# Patient Record
Sex: Male | Born: 1960 | Race: White | Hispanic: No | Marital: Married | Smoking: Never smoker
Health system: Southern US, Community
[De-identification: ages and names within clinical notes are randomized; demographics above are authoritative.]

## PROBLEM LIST (undated history)

## (undated) DIAGNOSIS — K219 Gastro-esophageal reflux disease without esophagitis: Secondary | ICD-10-CM

## (undated) DIAGNOSIS — K635 Polyp of colon: Secondary | ICD-10-CM

## (undated) DIAGNOSIS — E785 Hyperlipidemia, unspecified: Secondary | ICD-10-CM

## (undated) DIAGNOSIS — N289 Disorder of kidney and ureter, unspecified: Secondary | ICD-10-CM

## (undated) DIAGNOSIS — N400 Enlarged prostate without lower urinary tract symptoms: Secondary | ICD-10-CM

## (undated) DIAGNOSIS — M722 Plantar fascial fibromatosis: Secondary | ICD-10-CM

## (undated) DIAGNOSIS — E8881 Metabolic syndrome: Secondary | ICD-10-CM

## (undated) DIAGNOSIS — G4730 Sleep apnea, unspecified: Secondary | ICD-10-CM

## (undated) DIAGNOSIS — R1110 Vomiting, unspecified: Secondary | ICD-10-CM

## (undated) DIAGNOSIS — Z87442 Personal history of urinary calculi: Secondary | ICD-10-CM

## (undated) DIAGNOSIS — K8020 Calculus of gallbladder without cholecystitis without obstruction: Secondary | ICD-10-CM

## (undated) DIAGNOSIS — K449 Diaphragmatic hernia without obstruction or gangrene: Secondary | ICD-10-CM

## (undated) DIAGNOSIS — K644 Residual hemorrhoidal skin tags: Secondary | ICD-10-CM

## (undated) HISTORY — DX: Metabolic syndrome: E88.810

## (undated) HISTORY — PX: COLONOSCOPY: SHX174

## (undated) HISTORY — DX: Diaphragmatic hernia without obstruction or gangrene: K44.9

## (undated) HISTORY — PX: BILATERAL KNEE ARTHROSCOPY: SUR91

## (undated) HISTORY — DX: Hyperlipidemia, unspecified: E78.5

## (undated) HISTORY — DX: Vomiting, unspecified: R11.10

## (undated) HISTORY — DX: Metabolic syndrome: E88.81

## (undated) HISTORY — DX: Polyp of colon: K63.5

## (undated) HISTORY — PX: UPPER GASTROINTESTINAL ENDOSCOPY: SHX188

## (undated) HISTORY — DX: Benign prostatic hyperplasia without lower urinary tract symptoms: N40.0

## (undated) HISTORY — DX: Plantar fascial fibromatosis: M72.2

## (undated) HISTORY — DX: Residual hemorrhoidal skin tags: K64.4

## (undated) HISTORY — DX: Sleep apnea, unspecified: G47.30

---

## 1998-09-08 ENCOUNTER — Emergency Department (HOSPITAL_COMMUNITY): Admission: EM | Admit: 1998-09-08 | Discharge: 1998-09-08 | Payer: Self-pay | Admitting: Emergency Medicine

## 1998-09-08 ENCOUNTER — Encounter: Payer: Self-pay | Admitting: Emergency Medicine

## 1999-03-16 ENCOUNTER — Encounter: Payer: Self-pay | Admitting: Internal Medicine

## 2000-07-22 ENCOUNTER — Ambulatory Visit (HOSPITAL_BASED_OUTPATIENT_CLINIC_OR_DEPARTMENT_OTHER): Admission: RE | Admit: 2000-07-22 | Discharge: 2000-07-22 | Payer: Self-pay | Admitting: *Deleted

## 2000-07-22 ENCOUNTER — Encounter: Payer: Self-pay | Admitting: Internal Medicine

## 2002-02-12 ENCOUNTER — Encounter: Payer: Self-pay | Admitting: Emergency Medicine

## 2002-02-12 ENCOUNTER — Emergency Department (HOSPITAL_COMMUNITY): Admission: EM | Admit: 2002-02-12 | Discharge: 2002-02-12 | Payer: Self-pay | Admitting: Emergency Medicine

## 2002-11-01 ENCOUNTER — Encounter: Payer: Self-pay | Admitting: Emergency Medicine

## 2002-11-01 ENCOUNTER — Emergency Department (HOSPITAL_COMMUNITY): Admission: EM | Admit: 2002-11-01 | Discharge: 2002-11-02 | Payer: Self-pay | Admitting: Emergency Medicine

## 2004-01-09 ENCOUNTER — Ambulatory Visit (HOSPITAL_COMMUNITY): Admission: RE | Admit: 2004-01-09 | Discharge: 2004-01-09 | Payer: Self-pay | Admitting: Family Medicine

## 2004-01-09 ENCOUNTER — Emergency Department (HOSPITAL_COMMUNITY): Admission: EM | Admit: 2004-01-09 | Discharge: 2004-01-09 | Payer: Self-pay | Admitting: Family Medicine

## 2004-01-30 ENCOUNTER — Emergency Department (HOSPITAL_COMMUNITY): Admission: EM | Admit: 2004-01-30 | Discharge: 2004-01-30 | Payer: Self-pay | Admitting: Emergency Medicine

## 2004-12-26 ENCOUNTER — Encounter (INDEPENDENT_AMBULATORY_CARE_PROVIDER_SITE_OTHER): Payer: Self-pay | Admitting: Specialist

## 2004-12-26 ENCOUNTER — Ambulatory Visit (HOSPITAL_BASED_OUTPATIENT_CLINIC_OR_DEPARTMENT_OTHER): Admission: RE | Admit: 2004-12-26 | Discharge: 2004-12-26 | Payer: Self-pay | Admitting: Plastic Surgery

## 2005-03-20 ENCOUNTER — Ambulatory Visit: Payer: Self-pay | Admitting: Gastroenterology

## 2005-08-08 ENCOUNTER — Ambulatory Visit: Payer: Self-pay | Admitting: Internal Medicine

## 2005-10-10 ENCOUNTER — Ambulatory Visit: Payer: Self-pay | Admitting: Internal Medicine

## 2005-12-05 ENCOUNTER — Ambulatory Visit: Payer: Self-pay | Admitting: Gastroenterology

## 2006-01-02 ENCOUNTER — Ambulatory Visit: Payer: Self-pay | Admitting: Gastroenterology

## 2006-01-02 HISTORY — PX: COLONOSCOPY: SHX174

## 2006-08-04 ENCOUNTER — Ambulatory Visit: Payer: Self-pay | Admitting: Gastroenterology

## 2006-11-25 ENCOUNTER — Ambulatory Visit: Payer: Self-pay | Admitting: Internal Medicine

## 2006-11-25 DIAGNOSIS — N529 Male erectile dysfunction, unspecified: Secondary | ICD-10-CM

## 2006-11-29 LAB — CONVERTED CEMR LAB
BUN: 11 mg/dL
Basophils Absolute: 0.1 K/uL
Basophils Relative: 1 %
CO2: 29 meq/L
Calcium: 9.1 mg/dL
Chloride: 108 meq/L
Creatinine, Ser: 1 mg/dL
Eosinophils Absolute: 0.1 K/uL
Eosinophils Relative: 2.3 %
Free T4: 0.7 ng/dL
GFR calc Af Amer: 104 mL/min
GFR calc non Af Amer: 86 mL/min
Glucose, Bld: 126 mg/dL — ABNORMAL HIGH
HCT: 44 %
Hemoglobin: 15.5 g/dL
Hgb A1c MFr Bld: 6 %
Lymphocytes Relative: 32.5 %
MCHC: 35.1 g/dL
MCV: 86.5 fL
Monocytes Absolute: 0.4 K/uL
Monocytes Relative: 7 %
Neutro Abs: 3.4 K/uL
Neutrophils Relative %: 57.2 %
Platelets: 218 K/uL
Potassium: 4.2 meq/L
Prolactin: 12.2 ng/mL
RBC: 5.09 M/uL
RDW: 11.8 %
Sodium: 142 meq/L
TSH: 0.8 u[IU]/mL
WBC: 5.9 10*3/microliter

## 2006-12-01 ENCOUNTER — Encounter (INDEPENDENT_AMBULATORY_CARE_PROVIDER_SITE_OTHER): Payer: Self-pay | Admitting: *Deleted

## 2006-12-11 ENCOUNTER — Ambulatory Visit: Payer: Self-pay | Admitting: Pulmonary Disease

## 2007-02-17 DIAGNOSIS — F40298 Other specified phobia: Secondary | ICD-10-CM

## 2007-02-18 ENCOUNTER — Telehealth: Payer: Self-pay | Admitting: Pulmonary Disease

## 2007-02-23 ENCOUNTER — Telehealth (INDEPENDENT_AMBULATORY_CARE_PROVIDER_SITE_OTHER): Payer: Self-pay | Admitting: *Deleted

## 2007-03-11 ENCOUNTER — Ambulatory Visit: Payer: Self-pay | Admitting: Pulmonary Disease

## 2007-03-11 DIAGNOSIS — G4733 Obstructive sleep apnea (adult) (pediatric): Secondary | ICD-10-CM

## 2007-10-08 ENCOUNTER — Telehealth: Payer: Self-pay | Admitting: Internal Medicine

## 2007-10-26 ENCOUNTER — Telehealth (INDEPENDENT_AMBULATORY_CARE_PROVIDER_SITE_OTHER): Payer: Self-pay | Admitting: *Deleted

## 2007-11-05 ENCOUNTER — Ambulatory Visit: Payer: Self-pay | Admitting: Pulmonary Disease

## 2007-11-23 ENCOUNTER — Ambulatory Visit: Payer: Self-pay | Admitting: Internal Medicine

## 2007-11-23 DIAGNOSIS — K219 Gastro-esophageal reflux disease without esophagitis: Secondary | ICD-10-CM

## 2007-11-23 DIAGNOSIS — E669 Obesity, unspecified: Secondary | ICD-10-CM

## 2007-11-24 ENCOUNTER — Telehealth: Payer: Self-pay | Admitting: Internal Medicine

## 2007-12-29 ENCOUNTER — Encounter: Payer: Self-pay | Admitting: Internal Medicine

## 2007-12-30 ENCOUNTER — Ambulatory Visit: Payer: Self-pay | Admitting: Internal Medicine

## 2007-12-30 DIAGNOSIS — F458 Other somatoform disorders: Secondary | ICD-10-CM

## 2007-12-30 DIAGNOSIS — E785 Hyperlipidemia, unspecified: Secondary | ICD-10-CM | POA: Insufficient documentation

## 2007-12-30 DIAGNOSIS — M545 Low back pain: Secondary | ICD-10-CM | POA: Insufficient documentation

## 2007-12-30 DIAGNOSIS — J309 Allergic rhinitis, unspecified: Secondary | ICD-10-CM | POA: Insufficient documentation

## 2008-01-01 ENCOUNTER — Encounter (INDEPENDENT_AMBULATORY_CARE_PROVIDER_SITE_OTHER): Payer: Self-pay | Admitting: *Deleted

## 2008-02-11 ENCOUNTER — Telehealth (INDEPENDENT_AMBULATORY_CARE_PROVIDER_SITE_OTHER): Payer: Self-pay | Admitting: *Deleted

## 2008-02-29 ENCOUNTER — Ambulatory Visit: Payer: Self-pay | Admitting: Internal Medicine

## 2008-03-25 ENCOUNTER — Ambulatory Visit: Payer: Self-pay | Admitting: Internal Medicine

## 2008-05-09 ENCOUNTER — Ambulatory Visit: Payer: Self-pay | Admitting: Internal Medicine

## 2008-05-09 DIAGNOSIS — J019 Acute sinusitis, unspecified: Secondary | ICD-10-CM

## 2008-05-13 ENCOUNTER — Encounter: Payer: Self-pay | Admitting: Internal Medicine

## 2008-05-13 ENCOUNTER — Ambulatory Visit: Payer: Self-pay | Admitting: Internal Medicine

## 2008-05-13 HISTORY — PX: ESOPHAGOGASTRODUODENOSCOPY: SHX5428

## 2008-05-23 ENCOUNTER — Telehealth (INDEPENDENT_AMBULATORY_CARE_PROVIDER_SITE_OTHER): Payer: Self-pay | Admitting: *Deleted

## 2008-06-02 ENCOUNTER — Encounter: Payer: Self-pay | Admitting: Internal Medicine

## 2008-06-17 ENCOUNTER — Ambulatory Visit: Payer: Self-pay | Admitting: Internal Medicine

## 2008-06-20 ENCOUNTER — Encounter (INDEPENDENT_AMBULATORY_CARE_PROVIDER_SITE_OTHER): Payer: Self-pay | Admitting: *Deleted

## 2008-06-23 ENCOUNTER — Ambulatory Visit: Payer: Self-pay | Admitting: Pulmonary Disease

## 2008-06-23 DIAGNOSIS — G4710 Hypersomnia, unspecified: Secondary | ICD-10-CM | POA: Insufficient documentation

## 2008-08-02 ENCOUNTER — Encounter: Payer: Self-pay | Admitting: Pulmonary Disease

## 2008-09-25 ENCOUNTER — Emergency Department (HOSPITAL_COMMUNITY): Admission: EM | Admit: 2008-09-25 | Discharge: 2008-09-25 | Payer: Self-pay | Admitting: Family Medicine

## 2008-10-05 DIAGNOSIS — R5383 Other fatigue: Secondary | ICD-10-CM

## 2008-10-05 DIAGNOSIS — R5381 Other malaise: Secondary | ICD-10-CM | POA: Insufficient documentation

## 2008-10-25 ENCOUNTER — Encounter: Payer: Self-pay | Admitting: Internal Medicine

## 2008-12-13 ENCOUNTER — Telehealth: Payer: Self-pay | Admitting: Internal Medicine

## 2009-03-04 HISTORY — PX: PILONIDAL CYST / SINUS EXCISION: SUR543

## 2009-03-28 ENCOUNTER — Ambulatory Visit: Payer: Self-pay | Admitting: Family Medicine

## 2009-03-28 DIAGNOSIS — M722 Plantar fascial fibromatosis: Secondary | ICD-10-CM

## 2009-03-28 DIAGNOSIS — L851 Acquired keratosis [keratoderma] palmaris et plantaris: Secondary | ICD-10-CM

## 2009-03-28 HISTORY — DX: Plantar fascial fibromatosis: M72.2

## 2009-04-20 ENCOUNTER — Emergency Department (HOSPITAL_COMMUNITY): Admission: EM | Admit: 2009-04-20 | Discharge: 2009-04-20 | Payer: Self-pay | Admitting: Family Medicine

## 2009-04-21 ENCOUNTER — Emergency Department (HOSPITAL_COMMUNITY): Admission: EM | Admit: 2009-04-21 | Discharge: 2009-04-21 | Payer: Self-pay | Admitting: Emergency Medicine

## 2009-05-19 ENCOUNTER — Encounter: Payer: Self-pay | Admitting: Internal Medicine

## 2009-08-28 ENCOUNTER — Telehealth: Payer: Self-pay | Admitting: Internal Medicine

## 2009-09-18 ENCOUNTER — Ambulatory Visit: Payer: Self-pay | Admitting: Internal Medicine

## 2009-09-18 DIAGNOSIS — L0591 Pilonidal cyst without abscess: Secondary | ICD-10-CM | POA: Insufficient documentation

## 2009-09-18 DIAGNOSIS — K439 Ventral hernia without obstruction or gangrene: Secondary | ICD-10-CM | POA: Insufficient documentation

## 2009-09-19 ENCOUNTER — Encounter: Payer: Self-pay | Admitting: Internal Medicine

## 2009-11-02 ENCOUNTER — Ambulatory Visit (HOSPITAL_COMMUNITY): Admission: RE | Admit: 2009-11-02 | Discharge: 2009-11-02 | Payer: Self-pay | Admitting: Surgery

## 2009-11-08 ENCOUNTER — Encounter: Payer: Self-pay | Admitting: Internal Medicine

## 2009-11-15 ENCOUNTER — Encounter: Payer: Self-pay | Admitting: Internal Medicine

## 2009-12-07 ENCOUNTER — Encounter: Payer: Self-pay | Admitting: Internal Medicine

## 2009-12-18 ENCOUNTER — Encounter: Payer: Self-pay | Admitting: Internal Medicine

## 2010-01-02 ENCOUNTER — Encounter: Payer: Self-pay | Admitting: Internal Medicine

## 2010-03-24 ENCOUNTER — Encounter: Payer: Self-pay | Admitting: Family Medicine

## 2010-04-01 LAB — CONVERTED CEMR LAB
AST: 35 units/L (ref 0–37)
Albumin: 4 g/dL (ref 3.5–5.2)
Alkaline Phosphatase: 95 units/L (ref 39–117)
Basophils Relative: 0 % (ref 0.0–3.0)
CO2: 29 meq/L (ref 19–32)
Calcium: 8.9 mg/dL (ref 8.4–10.5)
Creatinine, Ser: 0.9 mg/dL (ref 0.4–1.5)
Eosinophils Relative: 1.4 % (ref 0.0–5.0)
GFR calc Af Amer: 117 mL/min
GFR calc non Af Amer: 97 mL/min
HCT: 46.4 % (ref 39.0–52.0)
Hgb A1c MFr Bld: 6.2 % — ABNORMAL HIGH (ref 4.6–6.0)
Lymphocytes Relative: 29.7 % (ref 12.0–46.0)
MCHC: 34.8 g/dL (ref 30.0–36.0)
Platelets: 191 10*3/uL (ref 150–400)
RDW: 12.2 % (ref 11.5–14.6)
Total Bilirubin: 1 mg/dL (ref 0.3–1.2)

## 2010-04-03 NOTE — Assessment & Plan Note (Signed)
Summary: head congestion//fd   Vital Signs:  Patient profile:   50 year old male Height:      69 inches Weight:      229 pounds BMI:     33.94 Temp:     98.3 degrees F oral Pulse rate:   90 / minute Pulse rhythm:   regular BP sitting:   124 / 80  (left arm) Cuff size:   regular  Vitals Entered By: Ryan Ray CMA (March 28, 2009 2:51 PM) CC: Pt c/o chest congestion, some nasal congestion. Teeth ache. Coughing is worse in the am. , URI symptoms   History of Present Illness:       This is a 50 year old man who presents with URI symptoms.  The symptoms began 1 week ago.  The patient complains of nasal congestion, purulent nasal discharge, and productive cough.  The patient denies fever, low-grade fever (<100.5 degrees), fever of 100.5-103 degrees, fever of 103.1-104 degrees, fever to >104 degrees, stiff neck, dyspnea, wheezing, rash, vomiting, diarrhea, use of an antipyretic, and response to antipyretic.  The patient also reports headache.  The patient denies itchy watery eyes, itchy throat, sneezing, seasonal symptoms, response to antihistamine, muscle aches, and severe fatigue.  The patient denies the following risk factors for Strep sinusitis: unilateral facial pain, unilateral nasal discharge, poor response to decongestant, double sickening, tooth pain, Strep exposure, tender adenopathy, and absence of cough.    Current Medications (verified): 1)  Nexium 40 Mg  Cpdr (Esomeprazole Magnesium) .Marland Kitchen.. 1 By Mouth Once Daily 2)  Cpap .... At Bedtime 3)  Augmentin 875-125 Mg Tabs (Amoxicillin-Pot Clavulanate) .Marland Kitchen.. 1 By Mouth Two Times A Day 4)  Flonase 50 Mcg/act Susp (Fluticasone Propionate) .... 2 Sprays Each Nostril Once Daily 5)  Clarinex 5 Mg Tabs (Desloratadine) .Marland Kitchen.. 1 By Mouth Once Daily  Allergies: 1)  ! Tylox  Past History:  Past medical, surgical, family and social histories (including risk factors) reviewed for relevance to current acute and chronic problems.  Past  Medical History: Reviewed history from 12/30/2007 and no changes required. GERD hiatal hernia ; Sleep apnea Allergic rhinitis  Past Surgical History: Reviewed history from 12/30/2007 and no changes required. Knee Arthroscopy X 2 Endo : ERD 1999; colonoscopy 2007: hemorrhoids  Family History: Reviewed history from 12/30/2007 and no changes required. Father: DM,HTN,esophageal CA Mother: HTN, Crohn's Siblings: bro HTN,Crohn's  Social History: Reviewed history from 12/30/2007 and no changes required. 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  Alcohol Use - no Illicit Drug Use - no Patient does not get regular exercise.  Married, 2 children  Review of Systems      See HPI  Physical Exam  General:  Well-developed,well-nourished,in no acute distress; alert,appropriate and cooperative throughout examination Ears:  External ear exam shows no significant lesions or deformities.  Otoscopic examination reveals clear canals, tympanic membranes are intact bilaterally without bulging, retraction, inflammation or discharge. Hearing is grossly normal bilaterally. Nose:  L frontal sinus tenderness, L maxillary sinus tenderness, R frontal sinus tenderness, and R maxillary sinus tenderness.   Mouth:  Oral mucosa and oropharynx without lesions or exudates.  Teeth in good repair. Neck:  cervical lymphadenopathy.   Lungs:  Normal respiratory effort, chest expands symmetrically. Lungs are clear to auscultation, no crackles or wheezes. Heart:  Normal rate and regular rhythm. S1 and S2 normal without gallop, murmur, click, rub or other extra sounds. Psych:  Cognition and judgment appear intact. Alert and cooperative with  normal attention span and concentration. No apparent delusions, illusions, hallucinations   Impression & Recommendations:  Problem # 1:  SINUSITIS - ACUTE-NOS (ICD-461.9)  His updated medication list for this problem includes:    Augmentin  875-125 Mg Tabs (Amoxicillin-pot clavulanate) .Marland Kitchen... 1 by mouth two times a day    Flonase 50 Mcg/act Susp (Fluticasone propionate) .Marland Kitchen... 2 sprays each nostril once daily  Instructed on treatment. Call if symptoms persist or worsen.   Problem # 2:  PLANTAR FASCIITIS, RIGHT (ICD-728.71)  Discussed use of gel inserts, ice massage, and stretching exercises.   Problem # 3:  DRY SKIN (ICD-701.1) lac hydrin  Complete Medication List: 1)  Nexium 40 Mg Cpdr (Esomeprazole magnesium) .Marland Kitchen.. 1 by mouth once daily 2)  Cpap  .... At bedtime 3)  Augmentin 875-125 Mg Tabs (Amoxicillin-pot clavulanate) .Marland Kitchen.. 1 by mouth two times a day 4)  Flonase 50 Mcg/act Susp (Fluticasone propionate) .... 2 sprays each nostril once daily 5)  Clarinex 5 Mg Tabs (Desloratadine) .Marland Kitchen.. 1 by mouth once daily Prescriptions: FLONASE 50 MCG/ACT SUSP (FLUTICASONE PROPIONATE) 2 sprays each nostril once daily  #1 x 0   Entered and Authorized by:   Loreen Freud DO   Signed by:   Loreen Freud DO on 03/28/2009   Method used:   Electronically to        [facility]  [address] [phone]* (retail)       [address]       [place], [place]  [address]       Ph: [phone]       Fax: [phone]   RxID:   [id number] AUGMENTIN 875-125 MG TABS (AMOXICILLIN-POT CLAVULANATE) 1 by mouth two times a day  #20 x 0   Entered and Authorized by:   Loreen Freud DO   Signed by:   Loreen Freud DO on 03/28/2009   Method used:   [facility]ically to        [facility]  [address] [phone]* (retail)       [address]       [place], [place]  [address]       Ph: [phone]       Fax: [phone]   RxID:   [id number]

## 2010-04-03 NOTE — Letter (Signed)
Summary: [facility]   Imported By: Lanelle Bal 05/25/2009 11:13:37  _____________________________________________________________________  External Attachment:    Type:   Image     Comment:   External Document

## 2010-04-03 NOTE — Progress Notes (Signed)
Summary: triage   Phone Note Call from Patient Call back at [phone]   Caller: Patient Call For: Ryan Ray Reason for Call: Talk to Nurse Summary of Call: Patient would like to see Dr Ryan Ray sooner than first available appt 8-4 for stomach swelling and pain. Initial call taken by: Tawni Levy,  August 28, 2009 12:58 PM  Follow-up for Phone Call        Patient c/o abdominal bloating, bulge,  and  tenderness.  He also has an increase in GERD.  Patient  is scheduled to see Dr Ryan Ray for 09/18/09 9:30 Follow-up by: Darcey Nora RN, CGRN,  August 28, 2009 1:15 PM

## 2010-04-03 NOTE — Letter (Signed)
Summary: [facility]   Imported By: Sherian Rein 01/11/2010 10:06:06  _____________________________________________________________________  External Attachment:    Type:   Image     Comment:   External Document

## 2010-04-03 NOTE — Letter (Signed)
Summary: [facility]   Imported By: Sherian Rein 01/23/2010 10:19:49  _____________________________________________________________________  External Attachment:    Type:   Image     Comment:   External Document

## 2010-04-03 NOTE — Assessment & Plan Note (Signed)
Summary: abdominal pain/GERD/sheri    History of Present Illness Visit Type: Follow-up Visit Primary GI MD: Stan Head MD [facility] Primary Provider: Marga Melnick MD Chief Complaint: Intermittent upper abd pain, burning sensation x 1 month. Pt states when he sits on the edge of the bed he notices a knot in his upper abdomen which he thinks could be a hernia. Pt also nas noticed a painful and irritating cyst above his anus.  History of Present Illness:   Some intermittent throat burning and chest burning.  Has noticed some epigastric burning in the epigastrium also. A handful of times only. No exertional component.  Alot of stress in last few months (mother and then dog died in Spring). He had lost then regained weight. 15# then regained 7# Exercise off due to plantar fasciitis and eating more after stressors.  He has noticed a cyst/"pimple" in perianal area  off/on x yrs It will swell and he pops it like a pimple It has never continued to drain lke now. Yellowish drainage.    GI Review of Systems    Reports abdominal pain and  bloating.     Location of  Abdominal pain: upper abdomen.    Denies acid reflux, chest pain, dysphagia with liquids, dysphagia with solids, heartburn, loss of appetite, nausea, vomiting, vomiting blood, weight loss, and  weight gain.        Denies anal fissure, black tarry stools, change in bowel habit, constipation, diarrhea, diverticulosis, fecal incontinence, heme positive stool, hemorrhoids, irritable bowel syndrome, jaundice, light color stool, liver problems, rectal bleeding, and  rectal pain. Preventive Screening-Counseling & Management  Alcohol-Tobacco     Smoking Status: never     Cigars/week: 0     Pipe use/week: 0     Cans of tobacco/week: 0     Tobacco Counseling: not indicated; no tobacco use  Caffeine-Diet-Exercise     Caffeine use/day: 3-4     Caffeine Counseling: decrease use of caffeine     Diet Counseling: to improve diet; diet is  suboptimal     Does Patient Exercise: no     Exercise Counseling: to improve exercise regimen  Comments: needs to get control of plntar fasciitis and resume walking and resume caloric reduction.    Current Medications (verified): 1)  Nexium 40 Mg  Cpdr (Esomeprazole Magnesium) .Marland Kitchen.. 1 By Mouth Once Daily 2)  Cpap .... At Bedtime  Allergies (verified): 1)  ! Tylox  Past History:  Past Medical History: Reviewed history from 12/30/2007 and no changes required. GERD hiatal hernia ; Sleep apnea Allergic rhinitis  Past Surgical History: Reviewed history from 12/30/2007 and no changes required. Knee Arthroscopy X 2 Endo : ERD 1999; colonoscopy 2007: hemorrhoids  Family History: Reviewed history from 12/30/2007 and no changes required. Father: DM,HTN,esophageal CA Mother: HTN, Crohn's Siblings: bro HTN,Crohn's  Social History: Reviewed history from 12/30/2007 and no changes required. 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  Alcohol Use - no Illicit Drug Use - no Patient does not get regular exercise.  Married, 2 children Caffeine use/day:  3-4 Cans of tobacco/week:  0 Pipe use/week:  0 Cigars/week:  0  Review of Systems       All other ROS negative except as per HPI.   Vital Signs:  Patient profile:   50 year old male Height:      69 inches Weight:      217.25 pounds BMI:     32.20 Pulse rate:   90 /  minute Pulse rhythm:   regular BP sitting:   126 / 74  (left arm) Cuff size:   regular  Vitals Entered By: Christie Nottingham CMA Duncan Dull) (September 18, 2009 9:43 AM)  Physical Exam  General:  obese.   Lungs:  Clear throughout to auscultation. Heart:  Regular rate and rhythm; no murmurs, rubs,  or bruits. Abdomen:  obese soft and non-tender small-mderate ventral hernia in midline, reducible seen best with flexion of abdomen Rectal:  1-2 mm opening with slight clear draianage overlying natal clect - over coccygeal area, a few  cm proximal to anus   Impression & Recommendations:  Problem # 1:  PILONIDAL CYST (ICD-685.1) Assessment New I believe this is what he has above the anus in natal cleft area. It needs to be assessed by a surgeon. Orders: [facility] ([facility])  Problem # 2:  VENTRAL HERNIA (ICD-553.20) Assessment: New I explained what this is and that weight loss may reduce it. Do not think other intervention necessary.  Problem # 3:  GERD (ICD-530.81) Assessment: Deteriorated a few spells of heartburn we discussed taking PPI before meal but seems like nighttime dosing enhances compliance for him as needed antacids to be added and to reduce caffeine  Problem # 4:  OBESITY (ICD-278.00) Assessment: Unchanged He is awarae and had lost Plabntar fasciitis and situational stressors have interefered advised to continue efforts and to seek help for plantar fasciitis again - has splints  Patient Instructions: 1)  You need to lose weight. Start by limiting portions, amounts. Avoid eating when not hungry. Limit desserts.Look for high fructose corn syrup on food labels and if in first 3 ingredients, avoid that food. Also try to eat whole grains, avoid "white foods" (e.g. white rice, white bread).   2)  Reduce caffeine and this will help GERD symtoms. 3)  Use Tums or Rolaids intermittently as needed for heartburn/GERD symptoms. If that is not helping let me know. 4)  Try to take your Nexium 30-60 minutes before a meal. 5)  Seek further help for your plantar fasciitis. Dr. Clelia Croft could direct you on this. 6)  Please schedule a follow-up appointment as needed.  7)  If the pilonidal cyst becomes enlarged and draining pus or you get a fever  again then contact us or go to ememrgency department (if this ocurs before you see the surgeon) 8)  You are scheduled to see Dr. Michaell Cowing on 09/19/09 @ 3:00 pm.  Please arrive at 2:45 pm to fill out paperwork. 9)  Nursing recommendation from [facility]  was to soak 2-3 times today if possible. 10)  Copy sent to : Kari Baars, MD 11)  The medication list was reviewed and reconciled.  All changed / newly prescribed medications were explained.  A complete medication list was provided to the patient / caregiver.  Appended Document: abdominal pain/GERD/sheri apparently now seeing Dr Eric Form

## 2010-04-03 NOTE — Letter (Signed)
Summary: [facility]   Imported By: Sherian Rein 12/20/2009 11:34:09  _____________________________________________________________________  External Attachment:    Type:   Image     Comment:   External Document

## 2010-04-03 NOTE — Letter (Signed)
Summary: [facility]   Imported By: Lanelle Bal 12/13/2009 13:53:09  _____________________________________________________________________  External Attachment:    Type:   Image     Comment:   External Document

## 2010-04-03 NOTE — Consult Note (Signed)
Summary: [facility]   Imported By: Sherian Rein 09/29/2009 14:00:05  _____________________________________________________________________  External Attachment:    Type:   Image     Comment:   External Document  Appended Document: [facility] ? seeing Dr Eric Form

## 2010-04-03 NOTE — Letter (Signed)
Summary: [facility]   Imported By: Lennie Odor 11/28/2009 15:11:54  _____________________________________________________________________  External Attachment:    Type:   Image     Comment:   External Document

## 2010-05-17 LAB — SURGICAL PCR SCREEN: Staphylococcus aureus: NEGATIVE

## 2010-06-10 LAB — POCT URINALYSIS DIP (DEVICE)
Nitrite: NEGATIVE
Protein, ur: NEGATIVE mg/dL
pH: 6 (ref 5.0–8.0)

## 2010-07-17 NOTE — Assessment & Plan Note (Signed)
[facility]                             PULMONARY OFFICE NOTE   Ryan Ray, Ryan Ray                   MRN:          [id number]  DATE:12/11/2006                            DOB:          04-Oct-1960    HISTORY OF PRESENT ILLNESS:  Ryan Ray is a 50 year old gentleman who  I have been asked to see for obstructive sleep apnea.  Ryan Ray had a  sleep study done in 2002 where he was found to have an apnea/hypopnea  index of 40 events per hour.  Ryan Ray was tried on CPAP for split  night protocol, but could not tolerate secondary to severe  claustrophobia.  Ryan Ray has not had a follow up set.  Typically  goes to bed between 10 and 11.  Gets up at 6:00 to start his day.  He  does not feel completely rested, but he really does not feel too bad  either.  Ryan Ray states that he does not get deep sleep, does not  remember dreaming and feels that he is aware of his surroundings during  Ryan night.  His wife does complain of snoring, but she has not mentioned  pauses of breathing during sleep.  Ryan Ray works in [employer]  and denies any significant alertness issues during Ryan day, but he is  very concerned about short term memory issues that he has noted.  Ryan  Ray does have occasional sleep pressure driving home in Ryan  afternoon but no frank sleepiness.  He will, however, fall asleep very  easily watching TV or movies in Ryan evening, and this bothers his wife  quit a bit.  Of note, Ryan Ray's weight is up about 15-20 pounds over  Ryan last two years.   PAST MEDICAL HISTORY:  1. Sleep apnea as stated above.  2. History of knee surgeries in Ryan past.   CURRENT MEDICATIONS:  Nexium 40 mg daily.   ALLERGIES:  TYLOX.   SOCIAL HISTORY:  He is married and has never smoked.   FAMILY HISTORY:  Remarkable for mother having arthritis, father having  throat cancer.   REVIEW OF SYSTEMS:  As per history of present illness, also, see  Ray  intake form documented on Ryan chart.   PHYSICAL EXAMINATION:  GENERAL:  He is an overweight male in no acute  distress.  VITAL SIGNS:  Blood pressure 140/82, pulse 104, temperature 98.8, weight  216 pounds, 5 feet 9 inches tall, O2 saturation on room air 98%.  HEENT:  Pupils equal, round and reactive to light and accommodation.  Extraocular movements intact.  Nares are patent with a turbinate  hypertrophy and some mild septal deviation.  Oropharynx does show  elongation of soft palate and uvula with some mildly prominent tonsils.  NECK:  Supple without JVD or lymphadenopathy.  There is no palpable  thyromegaly.  CHEST:  Totally clear.  CARDIAC:  Regular rate and rhythm.  No murmurs, rubs or gallops.  ABDOMEN:  Soft, nontender with good bowel sounds.  GENITAL/RECTAL/BREAST:  Not done and not indicated.  EXTREMITIES:  Lower extremities are without edema.  Pulses are intact  distally.  NEUROLOGICAL:  Alert and oriented with obvious motor deficits.   IMPRESSION:  History of severe obstructive sleep apnea.  Although Ryan  Ray is not severely symptomatic, he does have some ongoing issues.  I firmly believe that he is probably more symptomatic than what he  thinks.  I have discussed with him Ryan short term quality of life issues  associated with sleep apnea including sleep deprivation resulting in  short term memory issues.  I have also discussed with him Ryan long-term  cardiovascular complications.  Ryan Ray is willing to try CPAP again,  but we will have to help him with desensitization.  I have also  discussed with him Ryan possibility of oral appliance and also nasal and  upper airway surgery .   PLAN:  1. Work aggressively on weight loss.  2. Ryan Ray is willing to try CPAP again if we can get it approved      through insurance without having to repeat his sleep study.  If we      can, we will go ahead and initiate CPAP with an auto device, nasal      pillows as  well as a sedative/hypnotic to help with      desensitization.  If Ryan Ray is required to have another sleep      study,  he will have to decide whether he is going to do this      versus look at other options.     Barbaraann Share, MD,FCCP  Electronically Signed    [facility]  DD: 12/11/2006  DT: 12/12/2006  Job #: [id number]   cc:   Titus Dubin. Alwyn Ren, MD,FACP,FCCP

## 2010-07-17 NOTE — Assessment & Plan Note (Signed)
[facility]                         GASTROENTEROLOGY OFFICE NOTE   OTHON, GUARDIA                   MRN:          [id number]  DATE:08/04/2006                            DOB:          [birth year]    SUBJECTIVE:  Melbert comes in stating that he needs some medication for his  reflux.  Also wanted to know whether he needed another upper endoscopy.  I told him that this was not indicated and that the last one really did  not reveal anything but a small hernia with some esophagitis.  I really  did not think that under the circumstances he really has any other  significant symptoms or any change in symptoms or worsening of symptoms  or bleeding or evidence of Barrett's, so this was not indicated.  He  accepted this.   PHYSICAL EXAMINATION:  GENERAL:  A healthy gentleman weighing 208  pounds.  VITAL SIGNS:  Blood pressure 144/68, pulse 80 and regular.  NECK:  Unremarkable.  HEART:  Unremarkable.  EXTREMITIES:  Unremarkable.  ABDOMEN:  The patient's gastroesophageal reflux disease controlled with  Nexium.   IMPRESSION:  History of rectal bleeding, under control.   RECOMMENDATIONS:  To refill his Nexium for one year.  He is to follow up  in the future with one of my colleagues after my retirement.     Ulyess Mort, MD  Electronically Signed    S[facility]/MedQ  DD: 08/04/2006  DT: 08/05/2006  Job #: [id number]

## 2010-09-19 IMAGING — CR DG ABDOMEN 1V
1 series · 1 of 1 positions shown · non-contrast
Comparison: 01/30/2004

CLINICAL DATA: Flank pain, history of renal calculi

ABDOMEN - 1 VIEW

[view not recorded]
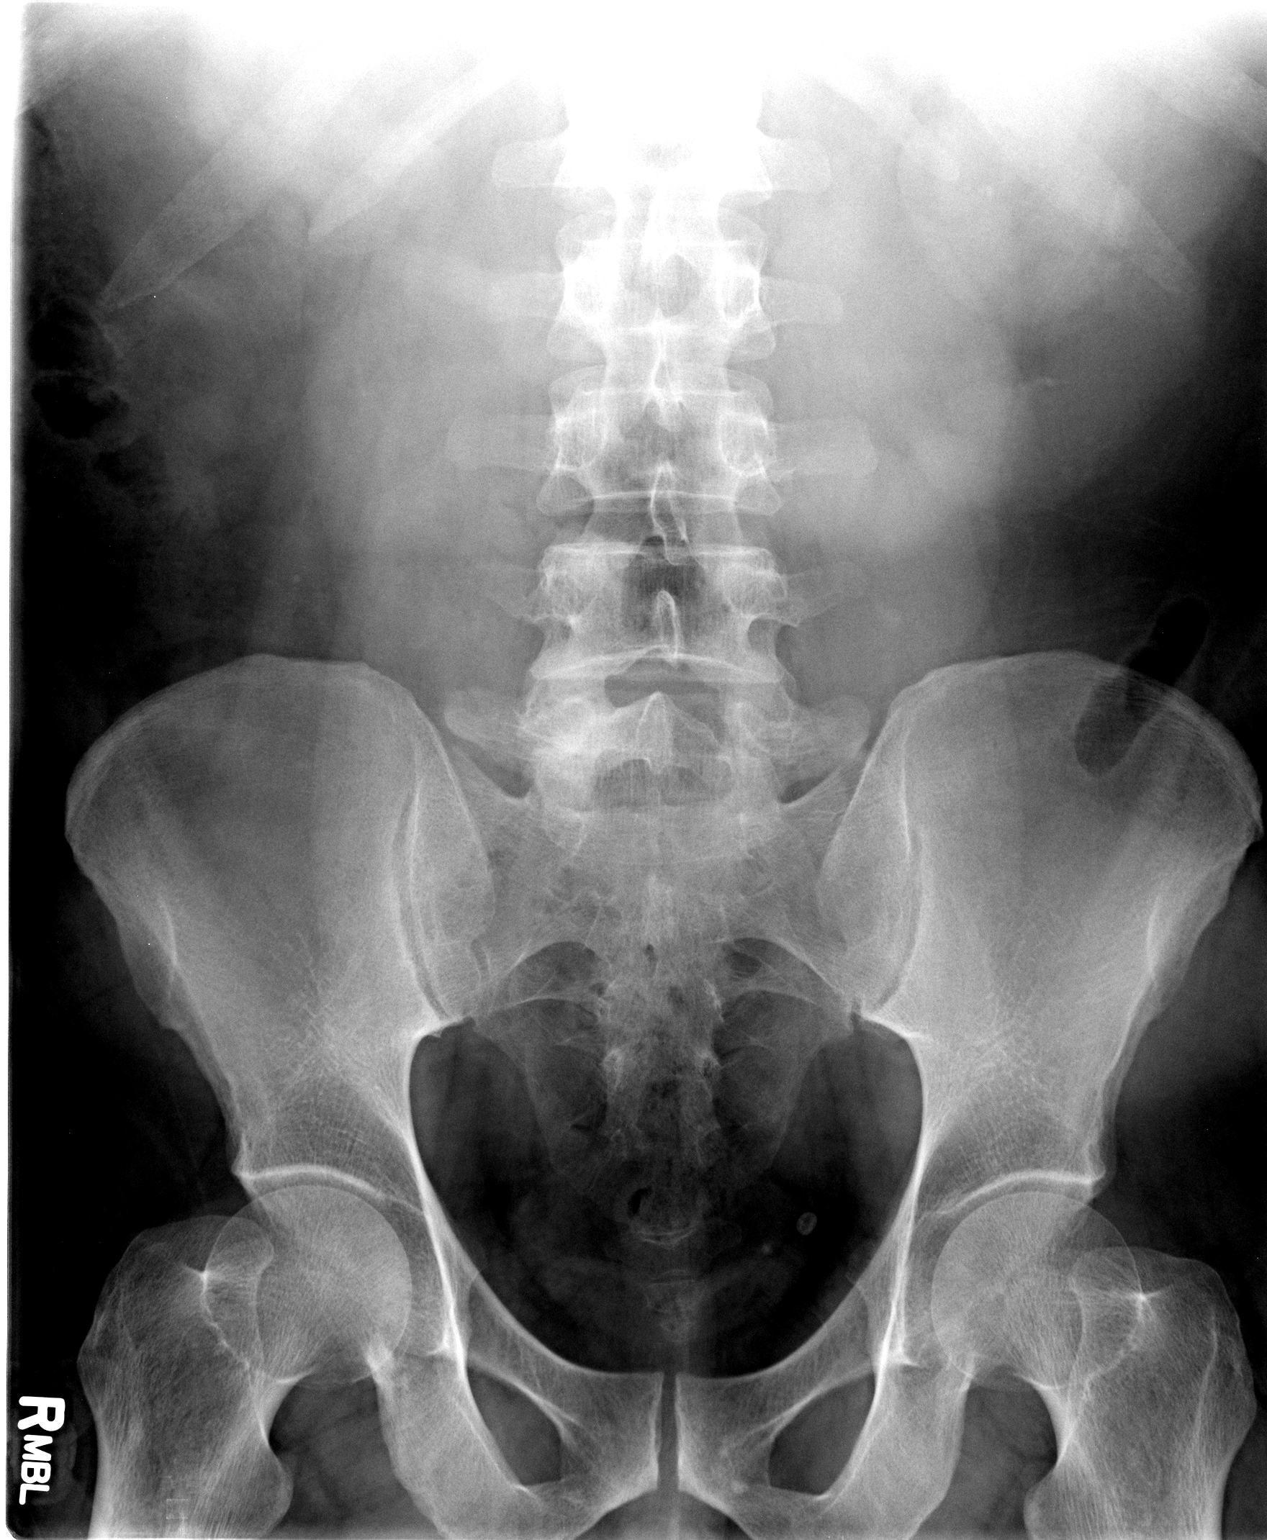

[1 of 1 positions shown; findings below may reference images not displayed]

FINDINGS: Punctate bilateral nephrolithiasis. Entire right renal
shadow not included on the study.  Normal bowel gas pattern.  2
left pelvic calcifications.  The medial left pelvic calcification
is new compared to the scout film of 01/30/2004.  Distal left UVJ
calculus could have this appearance.  No acute bony abnormality.
IMPRESSION: Bilateral nephrolithiasis
New left hemi pelvis calcification as described

## 2011-01-21 ENCOUNTER — Other Ambulatory Visit: Payer: Self-pay | Admitting: Internal Medicine

## 2011-01-21 ENCOUNTER — Other Ambulatory Visit: Payer: Self-pay | Admitting: Family Medicine

## 2011-01-22 ENCOUNTER — Ambulatory Visit
Admission: RE | Admit: 2011-01-22 | Discharge: 2011-01-22 | Disposition: A | Discharge status: Home / Self Care | Payer: BC Managed Care – PPO | Source: Ambulatory Visit | Attending: Internal Medicine | Admitting: Internal Medicine

## 2011-03-08 ENCOUNTER — Other Ambulatory Visit: Payer: Self-pay | Admitting: Gastroenterology

## 2011-03-08 MED ORDER — ESOMEPRAZOLE MAGNESIUM 40 MG PO CPDR: 40.0000 mg | DELAYED_RELEASE_CAPSULE | Freq: Every day | ORAL | Status: DC

## 2011-03-08 NOTE — Telephone Encounter (Signed)
Medication refill form faxed to [facility] at (fax) [phone], (phone) [phone].

## 2011-06-10 ENCOUNTER — Encounter (HOSPITAL_COMMUNITY): Payer: Self-pay | Admitting: Cardiology

## 2011-06-10 ENCOUNTER — Emergency Department (HOSPITAL_COMMUNITY)
Admission: EM | Admit: 2011-06-10 | Discharge: 2011-06-10 | Disposition: A | Discharge status: Home / Self Care | Payer: BC Managed Care – PPO | Source: Home / Self Care | Attending: Family Medicine | Admitting: Family Medicine

## 2011-06-10 ENCOUNTER — Ambulatory Visit: Payer: BC Managed Care – PPO | Admitting: Internal Medicine

## 2011-06-10 DIAGNOSIS — N200 Calculus of kidney: Secondary | ICD-10-CM

## 2011-06-10 HISTORY — DX: Personal history of urinary calculi: Z87.442

## 2011-06-10 HISTORY — DX: Gastro-esophageal reflux disease without esophagitis: K21.9

## 2011-06-10 LAB — POCT URINALYSIS DIP (DEVICE)
Hgb urine dipstick: NEGATIVE
Ketones, ur: NEGATIVE mg/dL
Leukocytes, UA: NEGATIVE
Protein, ur: NEGATIVE mg/dL
pH: 7 (ref 5.0–8.0)

## 2011-06-10 MED ORDER — HYDROMORPHONE HCL 2 MG PO TABS: 2.0000 mg | ORAL_TABLET | ORAL | Status: DC | PRN

## 2011-06-10 MED ORDER — ONDANSETRON HCL 4 MG PO TABS: 4.0000 mg | ORAL_TABLET | Freq: Three times a day (TID) | ORAL | Status: AC | PRN

## 2011-06-10 NOTE — ED Notes (Signed)
Pt reports stinging and burning to lower back that started around 4 pm today. The pain has now settled in to the left flank area. Pt noted blood on toilet tissue earlier today. The pain is not as intense as it was upon arrival today. Reports a sweat but no fever earlier today.

## 2011-06-10 NOTE — ED Provider Notes (Signed)
History     CSN: [id number]  Arrival date & time 06/10/11  1722   First MD Initiated Contact with Patient 06/10/11 1822      Chief Complaint  Patient presents with  . Nephrolithiasis    (Consider location/radiation/quality/duration/timing/severity/associated sxs/prior treatment) HPI Comments: Ryan Ray presents for evaluation of left flank pain that started earlier today. He also reports dysuria and blood when he wiped his penis earlier today. He reports a recurrent history of nephrolithiasis, with sometimes so severe his nausea and vomiting. He reports that this episode is not as severe as previous episodes, rating his pain at a 3/10 at this time. He denies any nausea or vomiting at this time. He denies any fever. Patient is a 51 y.o. male presenting with flank pain. The history is provided by the patient.  Flank Pain This is a new problem. The current episode started 6 to 12 hours ago. The problem occurs constantly. The problem has been gradually improving. Pertinent negatives include no chest pain, no abdominal pain, no headaches and no shortness of breath. The symptoms are aggravated by nothing. The symptoms are relieved by nothing. He has tried nothing for the symptoms.    Past Medical History  Diagnosis Date  . GERD (gastroesophageal reflux disease)   . Diabetes mellitus     diet controlled  . History of kidney stones     Past Surgical History  Procedure Date  . Knee surgery     bilat    No family history on file.  History  Substance Use Topics  . Smoking status: Never Smoker   . Smokeless tobacco: Former Neurosurgeon    Types: Chew    Quit date: 03/11/1981  . Alcohol Use: No      Review of Systems  Constitutional: Negative.   HENT: Negative.   Eyes: Negative.   Respiratory: Negative.  Negative for shortness of breath.   Cardiovascular: Negative.  Negative for chest pain.  Gastrointestinal: Negative.  Negative for abdominal pain.  Genitourinary: Positive for dysuria,  hematuria and flank pain. Negative for discharge.  Musculoskeletal: Negative.   Skin: Negative.   Neurological: Negative.  Negative for headaches.    Allergies  Oxycodone-acetaminophen  Home Medications   Current Outpatient Rx  Name Route Sig Dispense Refill  . ESOMEPRAZOLE MAGNESIUM 40 MG PO CPDR Oral Take 1 capsule (40 mg total) by mouth daily. 90 capsule 1  . HYDROMORPHONE HCL 2 MG PO TABS Oral Take 1 tablet (2 mg total) by mouth every 4 (four) hours as needed for pain. 30 tablet 0  . ONDANSETRON HCL 4 MG PO TABS Oral Take 1 tablet (4 mg total) by mouth every 8 (eight) hours as needed for nausea. 15 tablet 0    BP 126/84  Pulse 70  Temp(Src) 98.4 F (36.9 C) (Oral)  Resp 18  SpO2 99%  Physical Exam  Nursing note and vitals reviewed. Constitutional: He is oriented to person, place, and time. He appears well-developed and well-nourished.  HENT:  Head: Normocephalic and atraumatic.  Eyes: EOM are normal.  Neck: Normal range of motion.  Pulmonary/Chest: Effort normal.  Abdominal: Soft. Normal appearance and bowel sounds are normal. There is no tenderness. There is no CVA tenderness.  Musculoskeletal: Normal range of motion.  Neurological: He is alert and oriented to person, place, and time.  Skin: Skin is warm and dry.  Psychiatric: His behavior is normal.    ED Course  Procedures (including critical care time)   Labs Reviewed  POCT URINALYSIS  DIP (DEVICE)   No results found.   1. Kidney stone on left side       MDM  Labs reviewed; could have passed stone; will provide analgesia, encourage hydration; return if no relief        Renaee Munda, MD 06/10/11 2216

## 2011-06-10 NOTE — Discharge Instructions (Signed)
Take medications as directed. I encourage aggressive hydration with 10 to 12 glasses of water daily, avoid caffeinated drinks. Return to care should your symptoms not improve, or worsen in any way.

## 2011-06-14 ENCOUNTER — Encounter: Payer: Self-pay | Admitting: *Deleted

## 2011-06-18 ENCOUNTER — Encounter: Payer: Self-pay | Admitting: Internal Medicine

## 2011-06-18 ENCOUNTER — Ambulatory Visit (INDEPENDENT_AMBULATORY_CARE_PROVIDER_SITE_OTHER): Payer: BC Managed Care – PPO | Admitting: Internal Medicine

## 2011-06-18 VITALS — BP 124/76 | HR 88 | Ht 70.0 in | Wt 218.0 lb

## 2011-06-18 DIAGNOSIS — R198 Other specified symptoms and signs involving the digestive system and abdomen: Secondary | ICD-10-CM

## 2011-06-18 DIAGNOSIS — R1013 Epigastric pain: Secondary | ICD-10-CM

## 2011-06-18 DIAGNOSIS — R194 Change in bowel habit: Secondary | ICD-10-CM

## 2011-06-18 MED ORDER — ESOMEPRAZOLE MAGNESIUM 40 MG PO CPDR: 40.0000 mg | DELAYED_RELEASE_CAPSULE | Freq: Every day | ORAL | Status: DC

## 2011-06-18 MED ORDER — PEG-KCL-NACL-NASULF-NA ASC-C 100 G PO SOLR: 1.0000 | Freq: Once | ORAL | Status: DC

## 2011-06-18 NOTE — Progress Notes (Addendum)
Subjective:    Patient ID: Ryan Ray, male    DOB: Dec 02, 1960, 51 y.o.   MRN: [id number]  HPI The patient returns with complaints of epigastric pain and bowel habit changes. He was last seen in 2011, he was found to have a pilonidal cyst which was excised by the surgeon. He was also having upper abdominal pain at that time. He's had chronic recurrent issues with that. He says that he bloats after he eats, he is a burning pain there. He has been on multiple PPI as has been on Nexium for a number of years. Things were flaring in the fall when he saw his primary care physician, Dr. Clelia Croft. An ultrasound was performed in November and is reviewed and showed fatty liver but was otherwise unremarkable. The pancreas was not seen well. Pad testing showed normal amylase and lipase, CBC, he had a slightly elevated ALT at 41.   The patient describes that stools are intermittently loose and flat and light in color. He has difficulty cleansing at times as well and he will have incomplete defecation and then another stool within an hour after defecating at times. This has been ongoing for the last year. Stools are also described as narrow. There is no bleeding.  Over the years she's had episodic blockages, where he bloats in distends and has severe vomiting for about 24 hours. These are not associated with headaches or other obvious warning symptoms. There is associated epigastric pain. The last one of these was several months ago.  His recent flareup problems began a month ago, he has been tried exercise and lose weight. He has not been terribly successful that but realizes he needs to lose weight. He participated in a row and wall, he felt fine during that but afterwards developed multiple aches and pains and recurrent shin splints.  He also remains concerned about his diastases recti or hernias he calls it and then it has not been imaged were evaluated other than physical exam.  Allergies  Allergen  Reactions  . Oxycodone-Acetaminophen     REACTION: anxiety attack/hallucinations   Outpatient Prescriptions Prior to Visit  Medication Sig Dispense Refill  . esomeprazole (NEXIUM) 40 MG capsule Take 1 capsule (40 mg total) by mouth daily.  90 capsule  1  .      .       Past Medical History  Diagnosis Date  . GERD (gastroesophageal reflux disease)   . Diabetes mellitus     diet controlled  . History of kidney stones   . Hiatal hernia   . Sleep apnea   . External hemorrhoids without mention of complication    Past Surgical History  Procedure Date  . Bilateral knee arthroscopy   . Colonoscopy 01/02/2006  . Esophagogastroduodenoscopy 05/13/2008  . Pilonidal cyst / sinus excision 2011   History   Social History  . Marital Status: Married    Spouse Name: N/A    Number of Children: N/A  . Years of Education: N/A   Social History Main Topics  . Smoking status: Never Smoker   . Smokeless tobacco: Former Neurosurgeon    Types: Chew    Quit date: 03/11/1981  . Alcohol Use: No  . Drug Use: No  .       Family History  Problem Relation Age of Onset  . Colon cancer Neg Hx        Review of Systems He has a history of depression and says that he does not need  medication for that anymore. Feels like he is doing well overall with respect to that.    Objective:   Physical Exam General:  Well-developed, well-nourished and in no acute distress Eyes:  anicteric. ENT:   Mouth and posterior pharynx free of lesions.  Neck:   supple w/o thyromegaly or mass.  Lungs: Clear to auscultation bilaterally. Heart:  S1S2, no rubs, murmurs, gallops. Abdomen:  soft, mildly tender in the epigastrium, no hepatosplenomegaly, or mass and BS+. There is a diastases recti in the upper abdomen Rectal: Deferred Lymph:  no cervical or supraclavicular adenopathy. Extremities:   no edema Skin   no rash. Neuro:  A&O x 3.  Psych:  appropriate mood and  Affect.   Data Reviewed: As per history of present  illness       Assessment & Plan:   1. Epigastric pain, with bloating, early satiety   2. Change in bowel habits     Overall I suspect he has a functional bowel problem, like functional dyspepsia and IBS. After discussing this with him and various workup options versus empiric treatment we have decided to pursue an upper endoscopy and colonoscopy. He has had 2 EGDs and a single colonoscopy in the past. I suspect most of this is an IBS phenomena but that is a diagnosis of exclusion so we'll proceed with the studies to be more certain. He may need a change in his PPI. Another consideration would be to use buspirone which I have found to be quite helpful with functional dyspepsia in the past, especially the bloating. The risks and benefits as well as alternatives of endoscopic procedure(s) have been discussed and reviewed. All questions answered. The patient agrees to proceed.   As far as these intermittent rare episodes of nausea and vomiting associated with epigastric pain, I'm not certain. He is tolerated these for years. Abdominal migraine is a possibility but he does not have associated headaches. I may need to give him some Compazine or promethazine suppositories to keep on hand toward off attacks. Await the EGD and colonoscopy, consider other imaging but I suspect it would be low yield.  He clearly has anxiety over his symptoms, he lost both parents to cancer in his concerned about developing that. At this point there do not seem to be any clear worrisome features to anything but I do think the studies are warranted, and would hopefully reassure him.  He is aware his weight is a problem, and we did briefly discuss the need to restrict caloric intake as well as exercise and that weight loss could help many of these symptoms he has within the GI tract as well as musculoskeletal problems he is having.  ZO:XWRU,E DOUGLAS, MD  Further discussion with wife after endoscopies 08/01/2011 Corn,  pineapple and other fibrous foods cause problems with vomiting several hours after eating. Also has significant distention when that occurs.  Will try buspirone and intermittent compazine suppositories. Reasess in July. May need CT-E or CT abd/pelvis given his problems but if buspirone works probably not.  Iva Boop, MD, [facility] [phone] (pager) 08/01/2011 1:04 PM

## 2011-06-18 NOTE — Patient Instructions (Addendum)
Buspirone (Buspar)is the medication discussed about using for your symptoms - used to treat functional dyspepsia.  You have been scheduled for an endoscopy and colonoscopy.. Please follow the written instructions given to you at your visit today. Please pick up your prep at the pharmacy within the next 1-3 days.  We have sent your 90 day Rx for your Nexium to your mail order pharmacy as requested.

## 2011-06-19 ENCOUNTER — Encounter: Payer: Self-pay | Admitting: Internal Medicine

## 2011-08-01 ENCOUNTER — Encounter: Payer: Self-pay | Admitting: Internal Medicine

## 2011-08-01 ENCOUNTER — Ambulatory Visit (AMBULATORY_SURGERY_CENTER): Payer: BC Managed Care – PPO | Admitting: Internal Medicine

## 2011-08-01 VITALS — BP 141/100 | HR 84 | Temp 97.8°F | Resp 18 | Ht 70.0 in | Wt 218.0 lb

## 2011-08-01 DIAGNOSIS — R1013 Epigastric pain: Secondary | ICD-10-CM

## 2011-08-01 DIAGNOSIS — D129 Benign neoplasm of anus and anal canal: Secondary | ICD-10-CM

## 2011-08-01 DIAGNOSIS — R198 Other specified symptoms and signs involving the digestive system and abdomen: Secondary | ICD-10-CM

## 2011-08-01 DIAGNOSIS — D131 Benign neoplasm of stomach: Secondary | ICD-10-CM

## 2011-08-01 DIAGNOSIS — K449 Diaphragmatic hernia without obstruction or gangrene: Secondary | ICD-10-CM

## 2011-08-01 DIAGNOSIS — D128 Benign neoplasm of rectum: Secondary | ICD-10-CM

## 2011-08-01 DIAGNOSIS — K635 Polyp of colon: Secondary | ICD-10-CM

## 2011-08-01 DIAGNOSIS — K621 Rectal polyp: Secondary | ICD-10-CM

## 2011-08-01 DIAGNOSIS — K620 Anal polyp: Secondary | ICD-10-CM

## 2011-08-01 HISTORY — DX: Polyp of colon: K63.5

## 2011-08-01 MED ORDER — SODIUM CHLORIDE 0.9 % IV SOLN: 500.0000 mL | INTRAVENOUS | Status: DC

## 2011-08-01 MED ORDER — PROCHLORPERAZINE 25 MG RE SUPP: 25.0000 mg | Freq: Two times a day (BID) | RECTAL | Status: DC | PRN

## 2011-08-01 MED ORDER — BUSPIRONE HCL 10 MG PO TABS: 10.0000 mg | ORAL_TABLET | Freq: Two times a day (BID) | ORAL | Status: DC

## 2011-08-01 NOTE — Op Note (Signed)
[facility] [address] [place], [place]  [address]  ENDOSCOPY PROCEDURE REPORT  PATIENT:  Ryan Ray, Ryan Ray  MR#:  [id number] BIRTHDATE:  [birth year], 51 yrs. old  GENDER:  male  ENDOSCOPIST:  Iva Boop, MD, [facility]  PROCEDURE DATE:  08/01/2011 PROCEDURE:  EGD, diagnostic [id number] ASA CLASS:  Class II INDICATIONS:  epigastric pain  MEDICATIONS:   These medications were titrated to patient response per physician's verbal order, Fentanyl 50 mcg IV, Versed 3 mg IV TOPICAL ANESTHETIC:  Cetacaine Spray  DESCRIPTION OF PROCEDURE:   After the risks benefits and alternatives of the procedure were thoroughly explained, informed consent was obtained.  The LB GIF-H180 [id number] endoscope was introduced through the mouth and advanced to the second portion of the duodenum, without limitations.  The instrument was slowly withdrawn as the mucosa was fully examined. <<PROCEDUREIMAGES>>  There were multiple polyps identified. in the body of the stomach. Subcentimeter polyps consistent with previously biopsied fundic gland polyps.  A hiatal hernia was found. It was 3 cm in size. Sliding.  Otherwise the examination was normal.    Retroflexed views revealed a hiatal hernia.    The scope was then withdrawn from the patient and the procedure completed.  COMPLICATIONS:  None  ENDOSCOPIC IMPRESSION: 1) Polyps, multiple in the body of the stomach 2) 3 cm hiatal hernia 3) Otherwise normal examination - his problems are consistent with functional dyspepsia/IBS RECOMMENDATIONS: 1) Try buspirone 10 mg bid. Prescription sent. 2) call soon and make an appointment to see Dr. Leone Payor in July (about 6 weeks from now) 3) Compazine suppositories prescribed to keep on hand if episodic vomiting recurs 4) colonoscopy next  Iva Boop, MD, [facility]  CC:  Kari Baars, MD and The Patient  n. eSIGNED:   Iva Boop at 08/01/2011 12:24 PM  Tereasa Coop, [phone]

## 2011-08-01 NOTE — Patient Instructions (Signed)
NEW PRESCRIPTIONS INCLUDE:   BUSPIRONE 10 MG TWICE DAILY.  CALL DR. Marvell Fuller OFFICE TO SCHEDULE AN [phone], IN July(6 WEEKS FROM NOW.)   YOU HAD AN ENDOSCOPIC PROCEDURE TODAY AT THE [facility]: Refer to the procedure report that was given to you for any specific questions about what was found during the examination.  If the procedure report does not answer your questions, please call your gastroenterologist to clarify.  If you requested that your care partner not be given the details of your procedure findings, then the procedure report has been included in a sealed envelope for you to review at your convenience later.  YOU SHOULD EXPECT: Some feelings of bloating in the abdomen. Passage of more gas than usual.  Walking can help get rid of the air that was put into your GI tract during the procedure and reduce the bloating. If you had a lower endoscopy (such as a colonoscopy or flexible sigmoidoscopy) you may notice spotting of blood in your stool or on the toilet paper. If you underwent a bowel prep for your procedure, then you may not have a normal bowel movement for a few days.  DIET: Your first meal following the procedure should be a light meal and then it is ok to progress to your normal diet.  A half-sandwich or bowl of soup is an example of a good first meal.  Heavy or fried foods are harder to digest and may make you feel nauseous or bloated.  Likewise meals heavy in dairy and vegetables can cause extra gas to form and this can also increase the bloating.  Drink plenty of fluids but you should avoid alcoholic beverages for 24 hours.  ACTIVITY: Your care partner should take you home directly after the procedure.  You should plan to take it easy, moving slowly for the rest of the day.  You can resume normal activity the day after the procedure however you should NOT DRIVE or use heavy machinery for 24 hours (because of the sedation medicines used during the test).     SYMPTOMS TO REPORT IMMEDIATELY: A gastroenterologist can be reached at any hour.  During normal business hours, 8:30 AM to 5:00 PM Monday through Friday, call [phone].  After hours and on weekends, please call the GI answering service at [phone] who will take a message and have the physician on call contact you.   Following lower endoscopy (colonoscopy or flexible sigmoidoscopy):  Excessive amounts of blood in the stool  Significant tenderness or worsening of abdominal pains  Swelling of the abdomen that is new, acute  Fever of 100F or higher  Following upper endoscopy (EGD)  Vomiting of blood or coffee ground material  New chest pain or pain under the shoulder blades  Painful or persistently difficult swallowing  New shortness of breath  Fever of 100F or higher  Black, tarry-looking stools  FOLLOW UP: If any biopsies were taken you will be contacted by phone or by letter within the next 1-3 weeks.  Call your gastroenterologist if you have not heard about the biopsies in 3 weeks.  Our staff will call the home number listed on your records the next business day following your procedure to check on you and address any questions or concerns that you may have at that time regarding the information given to you following your procedure. This is a courtesy call and so if there is no answer at the home number and we have not heard from you  through the emergency physician on call, we will assume that you have returned to your regular daily activities without incident.  SIGNATURES/CONFIDENTIALITY: You and/or your care partner have signed paperwork which will be entered into your electronic medical record.  These signatures attest to the fact that that the information above on your After Visit Summary has been reviewed and is understood.  Full responsibility of the confidentiality of this discharge information lies with you and/or your care-partner.

## 2011-08-01 NOTE — Op Note (Signed)
[facility] [address] [place], [place]  [address]  COLONOSCOPY PROCEDURE REPORT  PATIENT:  Ryan Ray, Ryan Ray  MR#:  [id number] BIRTHDATE:  Mar 26, 1960, 51 yrs. old  GENDER:  male ENDOSCOPIST:  Iva Boop, MD, [facility]  PROCEDURE DATE:  08/01/2011 PROCEDURE:  Colonoscopy with snare polypectomy ASA CLASS:  Class II INDICATIONS:  change in bowel habits MEDICATIONS:   These medications were titrated to patient response per physician's verbal order, Versed 2 mg IV, Fentanyl 25 mcg IV, There was residual sedation effect present from prior procedure.  DESCRIPTION OF PROCEDURE:   After the risks benefits and alternatives of the procedure were thoroughly explained, informed consent was obtained.  Digital rectal exam was performed and revealed no abnormalities and normal prostate.   The LB CF-[id number] endoscope was introduced through the anus and advanced to the terminal ileum which was intubated for a short distance, without limitations.  The quality of the prep was excellent, using MoviPrep.  The instrument was then slowly withdrawn as the colon was fully examined. <<PROCEDUREIMAGES>>  FINDINGS:  A diminutive polyp was found in the rectum. Polyp was snared without cautery. Retrieval was successful. snare polyp This was otherwise a normal examination of the colon. Includes right colon retroflexion and terminal ileum inspection. Retroflexed views in the rectum revealed no abnormalities.    The time to cecum = 1:54 minutes. The scope was then withdrawn in 8:44 minutes from the cecum and the procedure completed. COMPLICATIONS:  None ENDOSCOPIC IMPRESSION: 1) Diminutive polyp in the rectum - removed 2) Otherwise normal examination - I believe he has IBS RECOMMENDATIONS: See EGD report REPEAT EXAM:  colonoscopy, pending biopsy results  Iva Boop, MD, [facility]  CC:  The Patient and Kari Baars, MD  n. Rosalie Doctor:   Iva Boop at 08/01/2011 12:36 PM  Tereasa Coop, [phone]

## 2011-08-01 NOTE — Progress Notes (Signed)
Patient did not experience any of the following events: a burn prior to discharge; a fall within the facility; wrong site/side/patient/procedure/implant event; or a hospital transfer or hospital admission upon discharge from the facility. (G8907) Patient did not have preoperative order for IV antibiotic SSI prophylaxis. (G8918)  

## 2011-08-02 ENCOUNTER — Telehealth: Payer: Self-pay | Admitting: *Deleted

## 2011-08-02 NOTE — Telephone Encounter (Signed)
  Follow up Call-  Call back number 08/01/2011  Post procedure Call Back phone  # [phone]  Permission to leave phone message Yes     Patient questions:  Do you have a fever, pain , or abdominal swelling? no Pain Score  0 *  Have you tolerated food without any problems? yes  Have you been able to return to your normal activities? yes  Do you have any questions about your discharge instructions: Diet   no Medications  no Follow up visit  no  Do you have questions or concerns about your Care? no  Actions: * If pain score is 4 or above: No action needed, pain <4.

## 2011-08-06 ENCOUNTER — Encounter: Payer: Self-pay | Admitting: Internal Medicine

## 2011-08-06 NOTE — Progress Notes (Signed)
Quick Note:  Hyperplastic polyp Repeat colonoscopy 10 years 2023 ______

## 2011-11-11 ENCOUNTER — Encounter (HOSPITAL_COMMUNITY): Payer: Self-pay | Admitting: *Deleted

## 2011-11-11 ENCOUNTER — Emergency Department (HOSPITAL_COMMUNITY): Payer: BC Managed Care – PPO

## 2011-11-11 ENCOUNTER — Emergency Department (HOSPITAL_COMMUNITY)
Admission: EM | Admit: 2011-11-11 | Discharge: 2011-11-12 | Disposition: A | Discharge status: Home / Self Care | Payer: BC Managed Care – PPO | Attending: Emergency Medicine | Admitting: Emergency Medicine

## 2011-11-11 ENCOUNTER — Emergency Department (HOSPITAL_COMMUNITY)
Admission: EM | Admit: 2011-11-11 | Discharge: 2011-11-11 | Disposition: A | Discharge status: Home / Self Care | Payer: BC Managed Care – PPO | Source: Home / Self Care | Attending: Emergency Medicine | Admitting: Emergency Medicine

## 2011-11-11 DIAGNOSIS — R112 Nausea with vomiting, unspecified: Secondary | ICD-10-CM | POA: Insufficient documentation

## 2011-11-11 DIAGNOSIS — R319 Hematuria, unspecified: Secondary | ICD-10-CM | POA: Insufficient documentation

## 2011-11-11 DIAGNOSIS — Z87442 Personal history of urinary calculi: Secondary | ICD-10-CM | POA: Insufficient documentation

## 2011-11-11 DIAGNOSIS — R109 Unspecified abdominal pain: Secondary | ICD-10-CM | POA: Insufficient documentation

## 2011-11-11 DIAGNOSIS — E119 Type 2 diabetes mellitus without complications: Secondary | ICD-10-CM | POA: Insufficient documentation

## 2011-11-11 DIAGNOSIS — N200 Calculus of kidney: Secondary | ICD-10-CM

## 2011-11-11 DIAGNOSIS — K219 Gastro-esophageal reflux disease without esophagitis: Secondary | ICD-10-CM | POA: Insufficient documentation

## 2011-11-11 DIAGNOSIS — Z79899 Other long term (current) drug therapy: Secondary | ICD-10-CM | POA: Insufficient documentation

## 2011-11-11 LAB — COMPREHENSIVE METABOLIC PANEL
ALT: 40 U/L (ref 0–53)
AST: 19 U/L (ref 0–37)
CO2: 24 mEq/L (ref 19–32)
Calcium: 9.7 mg/dL (ref 8.4–10.5)
Chloride: 98 mEq/L (ref 96–112)
GFR calc Af Amer: 72 mL/min — ABNORMAL LOW (ref >90)
GFR calc non Af Amer: 62 mL/min — ABNORMAL LOW (ref >90)
Glucose, Bld: 141 mg/dL — ABNORMAL HIGH (ref 70–99)
Sodium: 135 mEq/L (ref 135–145)
Total Bilirubin: 0.9 mg/dL (ref 0.3–1.2)

## 2011-11-11 LAB — POCT I-STAT, CHEM 8
BUN: 13 mg/dL (ref 6–23)
Calcium, Ion: 1.2 mmol/L (ref 1.12–1.23)
Chloride: 103 mEq/L (ref 96–112)
HCT: 46 % (ref 39.0–52.0)
Potassium: 3.9 mEq/L (ref 3.5–5.1)
Sodium: 138 mEq/L (ref 135–145)

## 2011-11-11 LAB — URINALYSIS, ROUTINE W REFLEX MICROSCOPIC
Glucose, UA: NEGATIVE mg/dL
Hgb urine dipstick: NEGATIVE
Leukocytes, UA: NEGATIVE
Specific Gravity, Urine: 1.022 (ref 1.005–1.030)
pH: 5.5 (ref 5.0–8.0)

## 2011-11-11 LAB — POCT URINALYSIS DIP (DEVICE)
Bilirubin Urine: NEGATIVE
Glucose, UA: NEGATIVE mg/dL
Leukocytes, UA: NEGATIVE
Nitrite: NEGATIVE

## 2011-11-11 LAB — CBC WITH DIFFERENTIAL/PLATELET
Eosinophils Relative: 0 % (ref 0–5)
HCT: 43.3 % (ref 39.0–52.0)
Lymphocytes Relative: 8 % — ABNORMAL LOW (ref 12–46)
Lymphs Abs: 1.2 10*3/uL (ref 0.7–4.0)
MCV: 83.3 fL (ref 78.0–100.0)
Monocytes Absolute: 1 10*3/uL (ref 0.1–1.0)
Neutro Abs: 12.1 10*3/uL — ABNORMAL HIGH (ref 1.7–7.7)
Platelets: 195 10*3/uL (ref 150–400)
RBC: 5.2 MIL/uL (ref 4.22–5.81)
WBC: 14.3 10*3/uL — ABNORMAL HIGH (ref 4.0–10.5)

## 2011-11-11 MED ORDER — ONDANSETRON HCL 4 MG/2ML IJ SOLN
4.0000 mg | Freq: Once | INTRAMUSCULAR | Status: AC
  Administered 2011-11-11: 4 mg via INTRAVENOUS
  Filled 2011-11-11: qty 2

## 2011-11-11 MED ORDER — ONDANSETRON HCL 4 MG PO TABS: 4.0000 mg | ORAL_TABLET | Freq: Three times a day (TID) | ORAL | Status: DC | PRN

## 2011-11-11 MED ORDER — SODIUM CHLORIDE 0.9 % IV BOLUS (SEPSIS)
250.0000 mL | Freq: Once | INTRAVENOUS | Status: AC
  Administered 2011-11-11: 250 mL via INTRAVENOUS

## 2011-11-11 MED ORDER — HYDROMORPHONE HCL 2 MG PO TABS: 2.0000 mg | ORAL_TABLET | Freq: Four times a day (QID) | ORAL | Status: DC | PRN

## 2011-11-11 MED ORDER — ACETAMINOPHEN 325 MG PO TABS: 650.0000 mg | ORAL_TABLET | Freq: Once | ORAL | Status: DC

## 2011-11-11 MED ORDER — KETOROLAC TROMETHAMINE 30 MG/ML IJ SOLN
30.0000 mg | Freq: Once | INTRAMUSCULAR | Status: AC
  Administered 2011-11-11: 30 mg via INTRAVENOUS
  Filled 2011-11-11: qty 1

## 2011-11-11 MED ORDER — ONDANSETRON 4 MG PO TBDP
ORAL_TABLET | ORAL | Status: AC
  Filled 2011-11-11: qty 1

## 2011-11-11 MED ORDER — ONDANSETRON 4 MG PO TBDP
8.0000 mg | ORAL_TABLET | Freq: Once | ORAL | Status: AC
  Administered 2011-11-11: 8 mg via ORAL
  Filled 2011-11-11: qty 2

## 2011-11-11 MED ORDER — HYDROMORPHONE HCL PF 1 MG/ML IJ SOLN
1.0000 mg | Freq: Once | INTRAMUSCULAR | Status: AC
  Administered 2011-11-11: 1 mg via INTRAVENOUS
  Filled 2011-11-11: qty 1

## 2011-11-11 MED ORDER — KETOROLAC TROMETHAMINE 30 MG/ML IJ SOLN
60.0000 mg | Freq: Once | INTRAMUSCULAR | Status: AC
  Administered 2011-11-11: 60 mg via INTRAMUSCULAR

## 2011-11-11 MED ORDER — TAMSULOSIN HCL 0.4 MG PO CAPS: 0.4000 mg | ORAL_CAPSULE | Freq: Every day | ORAL | Status: AC

## 2011-11-11 MED ORDER — HYDROMORPHONE HCL PF 1 MG/ML IJ SOLN
1.0000 mg | Freq: Once | INTRAMUSCULAR | Status: AC
  Administered 2011-11-11: 1 mg via INTRAMUSCULAR

## 2011-11-11 MED ORDER — KETOROLAC TROMETHAMINE 60 MG/2ML IM SOLN
INTRAMUSCULAR | Status: AC
  Filled 2011-11-11: qty 2

## 2011-11-11 MED ORDER — KETOROLAC TROMETHAMINE 10 MG PO TABS: 10.0000 mg | ORAL_TABLET | Freq: Four times a day (QID) | ORAL | Status: DC | PRN

## 2011-11-11 MED ORDER — PROCHLORPERAZINE 25 MG RE SUPP: 25.0000 mg | Freq: Two times a day (BID) | RECTAL | Status: DC | PRN

## 2011-11-11 MED ORDER — SODIUM CHLORIDE 0.9 % IV SOLN: INTRAVENOUS | Status: DC

## 2011-11-11 MED ORDER — ONDANSETRON 4 MG PO TBDP
4.0000 mg | ORAL_TABLET | Freq: Once | ORAL | Status: AC
  Administered 2011-11-11: 4 mg via ORAL

## 2011-11-11 MED ORDER — HYDROMORPHONE HCL PF 1 MG/ML IJ SOLN
INTRAMUSCULAR | Status: AC
  Filled 2011-11-11: qty 1

## 2011-11-11 NOTE — ED Provider Notes (Addendum)
History     CSN: [id number]  Arrival date & time 11/11/11  1903   First MD Initiated Contact with Patient 11/11/11 2218      Chief Complaint  Patient presents with  . Flank Pain    (Consider location/radiation/quality/duration/timing/severity/associated sxs/prior treatment) Patient is a 51 y.o. male presenting with flank pain. The history is provided by the patient and the spouse.  Flank Pain Associated symptoms include abdominal pain. Pertinent negatives include no chest pain, no headaches and no shortness of breath.   patient is a 51 year old male primary care doctors Dr. Candis Musa with onset of right flank pain consistent with passed kidney stones at 11:00 PM yesterday. Pain is continued throughout today he was seen at the urgent care this morning sent home with Flomax tramadol and oral hydromorphone. The pain is continued his not being controlled pain currently is 10 out of 10 pain is in the right flank radiating to the right groin is very sharp and colicky in nature. Does remind patient of past symptoms of kidney stones. Last kidney stone was in April. It is associated with some nausea and vomiting.  Past Medical History  Diagnosis Date  . GERD (gastroesophageal reflux disease)   . Diabetes mellitus     diet controlled  . History of kidney stones   . Hiatal hernia   . Sleep apnea   . External hemorrhoids without mention of complication     Past Surgical History  Procedure Date  . Bilateral knee arthroscopy   . Colonoscopy 01/02/2006  . Esophagogastroduodenoscopy 05/13/2008  . Pilonidal cyst / sinus excision 2011    Family History  Problem Relation Age of Onset  . Colon cancer Neg Hx   . Crohn's disease Mother   . Diabetes Father   . Crohn's disease Brother     History  Substance Use Topics  . Smoking status: Never Smoker   . Smokeless tobacco: Former Neurosurgeon    Types: Chew    Quit date: 03/11/1981  . Alcohol Use: No      Review of Systems  Constitutional:  Negative for fever.  HENT: Negative for congestion and neck pain.   Eyes: Negative for redness.  Respiratory: Negative for shortness of breath.   Cardiovascular: Negative for chest pain.  Gastrointestinal: Positive for nausea, vomiting and abdominal pain.  Genitourinary: Positive for hematuria and flank pain. Negative for dysuria.  Musculoskeletal: Positive for back pain.  Skin: Negative for pallor.  Neurological: Negative for headaches.  Hematological: Does not bruise/bleed easily.    Allergies  Oxycodone-acetaminophen  Home Medications   Current Outpatient Rx  Name Route Sig Dispense Refill  . ESOMEPRAZOLE MAGNESIUM 40 MG PO CPDR Oral Take 1 capsule (40 mg total) by mouth daily. 90 capsule 3  . HYDROMORPHONE HCL 2 MG PO TABS Oral Take 1 tablet (2 mg total) by mouth every 6 (six) hours as needed for pain. 20 tablet 0  . ONDANSETRON HCL 4 MG PO TABS Oral Take 1 tablet (4 mg total) by mouth every 8 (eight) hours as needed for nausea. 20 tablet 0  . OXYMETAZOLINE HCL 0.05 % NA SOLN Nasal Place 2 sprays into the nose daily as needed. For stuffy nose    . PROCHLORPERAZINE 25 MG RE SUPP Rectal Place 1 suppository (25 mg total) rectally every 12 (twelve) hours as needed for nausea. 12 suppository 0  . TAMSULOSIN HCL 0.4 MG PO CAPS Oral Take 1 capsule (0.4 mg total) by mouth at bedtime. 14 capsule 0  BP 129/77  Pulse 68  Temp 98.6 F (37 C) (Oral)  Resp 18  SpO2 99%  Physical Exam  Nursing note and vitals reviewed. Constitutional: He is oriented to person, place, and time. He appears well-developed and well-nourished.  HENT:  Head: Normocephalic and atraumatic.  Mouth/Throat: Oropharynx is clear and moist.  Eyes: Conjunctivae and EOM are normal. Pupils are equal, round, and reactive to light.  Neck: Normal range of motion. Neck supple.  Cardiovascular: Normal rate, regular rhythm and normal heart sounds.   Pulmonary/Chest: Effort normal and breath sounds normal. No respiratory  distress.  Abdominal: Soft. Bowel sounds are normal. There is no tenderness.  Musculoskeletal: Normal range of motion. He exhibits no edema and no tenderness.  Neurological: He is alert and oriented to person, place, and time. No cranial nerve deficit. He exhibits normal muscle tone.  Skin: Skin is warm. No rash noted.    ED Course  Procedures (including critical care time)  Labs Reviewed  CBC WITH DIFFERENTIAL - Abnormal; Notable for the following:    WBC 14.3 (*)     Neutrophils Relative 84 (*)     Neutro Abs 12.1 (*)     Lymphocytes Relative 8 (*)     All other components within normal limits  COMPREHENSIVE METABOLIC PANEL - Abnormal; Notable for the following:    Glucose, Bld 141 (*)     Alkaline Phosphatase 121 (*)     GFR calc non Af Amer 62 (*)     GFR calc Af Amer 72 (*)     All other components within normal limits  URINALYSIS, ROUTINE W REFLEX MICROSCOPIC   No results found. Results for orders placed during the hospital encounter of 11/11/11  URINALYSIS, ROUTINE W REFLEX MICROSCOPIC      Component Value Range   Color, Urine YELLOW  YELLOW   APPearance CLEAR  CLEAR   Specific Gravity, Urine 1.022  1.005 - 1.030   pH 5.5  5.0 - 8.0   Glucose, UA NEGATIVE  NEGATIVE mg/dL   Hgb urine dipstick NEGATIVE  NEGATIVE   Bilirubin Urine NEGATIVE  NEGATIVE   Ketones, ur NEGATIVE  NEGATIVE mg/dL   Protein, ur NEGATIVE  NEGATIVE mg/dL   Urobilinogen, UA 0.2  0.0 - 1.0 mg/dL   Nitrite NEGATIVE  NEGATIVE   Leukocytes, UA NEGATIVE  NEGATIVE  CBC WITH DIFFERENTIAL      Component Value Range   WBC 14.3 (*) 4.0 - 10.5 K/uL   RBC 5.20  4.22 - 5.81 MIL/uL   Hemoglobin 15.4  13.0 - 17.0 g/dL   HCT 11.9  14.7 - 82.9 %   MCV 83.3  78.0 - 100.0 fL   MCH 29.6  26.0 - 34.0 pg   MCHC 35.6  30.0 - 36.0 g/dL   RDW 56.2  13.0 - 86.5 %   Platelets 195  150 - 400 K/uL   Neutrophils Relative 84 (*) 43 - 77 %   Neutro Abs 12.1 (*) 1.7 - 7.7 K/uL   Lymphocytes Relative 8 (*) 12 - 46 %    Lymphs Abs 1.2  0.7 - 4.0 K/uL   Monocytes Relative 7  3 - 12 %   Monocytes Absolute 1.0  0.1 - 1.0 K/uL   Eosinophils Relative 0  0 - 5 %   Eosinophils Absolute 0.0  0.0 - 0.7 K/uL   Basophils Relative 0  0 - 1 %   Basophils Absolute 0.0  0.0 - 0.1 K/uL  COMPREHENSIVE METABOLIC PANEL  Component Value Range   Sodium 135  135 - 145 mEq/L   Potassium 3.7  3.5 - 5.1 mEq/L   Chloride 98  96 - 112 mEq/L   CO2 24  19 - 32 mEq/L   Glucose, Bld 141 (*) 70 - 99 mg/dL   BUN 14  6 - 23 mg/dL   Creatinine, Ser 0.98  0.50 - 1.35 mg/dL   Calcium 9.7  8.4 - 11.9 mg/dL   Total Protein 7.2  6.0 - 8.3 g/dL   Albumin 4.3  3.5 - 5.2 g/dL   AST 19  0 - 37 U/L   ALT 40  0 - 53 U/L   Alkaline Phosphatase 121 (*) 39 - 117 U/L   Total Bilirubin 0.9  0.3 - 1.2 mg/dL   GFR calc non Af Amer 62 (*) >90 mL/min   GFR calc Af Amer 72 (*) >90 mL/min     1. Flank pain       MDM  Patient with a history of kidney stones in the past onset of right-sided flank pain last night at 11:00 PM was seen in urgent care earlier today sent home with pain medicine but still having pain. Patient is followed by urology locally Dr. Abelardo Diesel, symptoms are associated with some nausea and some vomiting. Last kidney stone was in April. Pain is right-sided flank does radiate down towards the groin area. Patient is currently taking tramadol and oral dye lauded at home was also given Flomax.  Have ordered a CT abdomen pelvis without contrast to delineate the location of the stone that is pending. Patient has been given orders to be moved to CDU.        Shelda Jakes, MD 11/11/11 1478  Shelda Jakes, MD 11/14/11 2314

## 2011-11-11 NOTE — ED Provider Notes (Signed)
Pt to CDU awaiting for abd/pelvic CT w/out contrast to r/o kidney stone.  Report given to Dr. Dierdre Highman who will continue care.  Pt was not seen by me.  Fayrene Helper, PA-C 11/11/11 2359

## 2011-11-11 NOTE — ED Notes (Signed)
Pt c/o rt flank pain all day.  He was seen at urgent care this am for the same.  Nausea and vomiting

## 2011-11-11 NOTE — ED Provider Notes (Signed)
History     CSN: [id number]  Arrival date & time 11/11/11  0802   First MD Initiated Contact with Patient 11/11/11 [phone]      Chief Complaint  Patient presents with  . Flank Pain    (Consider location/radiation/quality/duration/timing/severity/associated sxs/prior treatment) HPI Comments: Past medical history of nonobstructing kidney stones presents with right back and flank pain described as sharp, waxing and waning, radiating down to his testicles. Reports nausea, vomiting. States that he is unable to get comfortable. States that he has urinated 3-4 times since his pain started, has not noticed any hematuria. No difficulty starting stream, urinary urgency, frequency, dysuria. No fevers, penile rash, penile discharge, testicular swelling. States it is identical to previous episodes of kidney stones. He is taking by mouth Dilaudid, last dose 10 hours ago, Zofran without improvement. Has not tried any NSAIDs. Urologist- Dr. Isabel Caprice  Patient is a 51 y.o. male presenting with flank pain. The history is provided by the patient. No language interpreter was used.  Flank Pain This is a recurrent problem. The current episode started 6 to 12 hours ago. The problem occurs constantly. The problem has not changed since onset.Associated symptoms include abdominal pain. Nothing aggravates the symptoms. Nothing relieves the symptoms. Treatments tried: Dilaudid, Zofran. The treatment provided no relief.    Past Medical History  Diagnosis Date  . GERD (gastroesophageal reflux disease)   . Diabetes mellitus     diet controlled  . History of kidney stones   . Hiatal hernia   . Sleep apnea   . External hemorrhoids without mention of complication     Past Surgical History  Procedure Date  . Bilateral knee arthroscopy   . Colonoscopy 01/02/2006  . Esophagogastroduodenoscopy 05/13/2008  . Pilonidal cyst / sinus excision 2011    Family History  Problem Relation Age of Onset  . Colon cancer Neg Hx   .  Crohn's disease Mother   . Diabetes Father   . Crohn's disease Brother     History  Substance Use Topics  . Smoking status: Never Smoker   . Smokeless tobacco: Former Neurosurgeon    Types: Chew    Quit date: 03/11/1981  . Alcohol Use: No      Review of Systems  Gastrointestinal: Positive for abdominal pain.  Genitourinary: Positive for flank pain.    Allergies  Oxycodone-acetaminophen  Home Medications   Current Outpatient Rx  Name Route Sig Dispense Refill  . ESOMEPRAZOLE MAGNESIUM 40 MG PO CPDR Oral Take 1 capsule (40 mg total) by mouth daily. 90 capsule 3  . HYDROMORPHONE HCL 2 MG PO TABS Oral Take 1 tablet (2 mg total) by mouth every 6 (six) hours as needed for pain. 20 tablet 0  . KETOROLAC TROMETHAMINE 10 MG PO TABS Oral Take 1 tablet (10 mg total) by mouth 4 (four) times daily as needed for pain. 20 tablet 0  . ONDANSETRON HCL 4 MG PO TABS Oral Take 1 tablet (4 mg total) by mouth every 8 (eight) hours as needed for nausea. 20 tablet 0  . PROCHLORPERAZINE 25 MG RE SUPP Rectal Place 1 suppository (25 mg total) rectally every 12 (twelve) hours as needed for nausea. 12 suppository 0  . TAMSULOSIN HCL 0.4 MG PO CAPS Oral Take 1 capsule (0.4 mg total) by mouth at bedtime. 14 capsule 0    BP 140/80  Pulse 86  Temp 98.1 F (36.7 C) (Oral)  Resp 22  SpO2 100%  Physical Exam  Nursing note and vitals reviewed.  Constitutional: He is oriented to person, place, and time. He appears well-developed and well-nourished. He appears distressed.       Pacing around room, uncomfortable  HENT:  Head: Normocephalic and atraumatic.  Eyes: Conjunctivae and EOM are normal.  Neck: Normal range of motion.  Cardiovascular: Normal rate.   Pulmonary/Chest: Effort normal. No respiratory distress.  Abdominal: Soft. He exhibits no distension and no mass. There is tenderness. There is CVA tenderness. There is no rebound and no guarding. Hernia confirmed negative in the right inguinal area and  confirmed negative in the left inguinal area.       Tenderness right UVJ, right flank, right CVA. Mild suprapubic tenderness   Genitourinary: Testes normal and penis normal. No penile erythema. No discharge found.  Musculoskeletal: Normal range of motion.  Neurological: He is alert and oriented to person, place, and time. Coordination normal.  Skin: Skin is warm and dry.  Psychiatric: He has a normal mood and affect. His behavior is normal. Judgment and thought content normal.    ED Course  Procedures (including critical care time)  Labs Reviewed  POCT URINALYSIS DIP (DEVICE) - Abnormal; Notable for the following:    Ketones, ur TRACE (*)     Hgb urine dipstick LARGE (*)     Protein, ur 30 (*)     All other components within normal limits  POCT I-STAT, CHEM 8 - Abnormal; Notable for the following:    Glucose, Bld 168 (*)     All other components within normal limits   No results found.   1. Nephrolithiasis     Results for orders placed during the hospital encounter of 11/11/11  POCT URINALYSIS DIP (DEVICE)      Component Value Range   Glucose, UA NEGATIVE  NEGATIVE mg/dL   Bilirubin Urine NEGATIVE  NEGATIVE   Ketones, ur TRACE (*) NEGATIVE mg/dL   Specific Gravity, Urine 1.025  1.005 - 1.030   Hgb urine dipstick LARGE (*) NEGATIVE   pH 5.5  5.0 - 8.0   Protein, ur 30 (*) NEGATIVE mg/dL   Urobilinogen, UA 0.2  0.0 - 1.0 mg/dL   Nitrite NEGATIVE  NEGATIVE   Leukocytes, UA NEGATIVE  NEGATIVE  POCT I-STAT, CHEM 8      Component Value Range   Sodium 138  135 - 145 mEq/L   Potassium 3.9  3.5 - 5.1 mEq/L   Chloride 103  96 - 112 mEq/L   BUN 13  6 - 23 mg/dL   Creatinine, Ser 1.61  0.50 - 1.35 mg/dL   Glucose, Bld 096 (*) 70 - 99 mg/dL   Calcium, Ion 0.45  1.12 - 1.23 mmol/L   TCO2 20  0 - 100 mmol/L   Hemoglobin 15.6  13.0 - 17.0 g/dL   HCT 40.9  81.1 - 91.4 %     MDM  Previous records reviewed. Patient seen April of this year for left flank pain, hematuria, dysuria,  thought to have kidney stone, managed as outpatient.  Patient with large hematuria consistent kidney stones. Kidney function WNL Giving Toradol 60 IM, Dilaudid 1 mg IM, Zofran 4 mg ODT. Will reevaluate.   On reevaluation, patient states he feels much better. Appears comfortable. Has decided to try outpatient therapy. We'll send him home with Compazine, Toradol, Dilaudid, Flomax. He will strain all his urine and followup with Dr. Isabel Caprice in several days. Discussed signs and symptoms that should prompt has returned to the department. Patient agrees with plan.  Luiz Blare,  MD 11/11/11 1015

## 2011-11-11 NOTE — ED Notes (Signed)
Pt reports pain is gone but still sore.

## 2011-11-11 NOTE — ED Notes (Signed)
Denies pain at this time. States he still feels nauseous.

## 2011-11-11 NOTE — ED Notes (Signed)
Pt states he is having right flank pain. Pt has hx of kidney stones. States pain is severe but does come and go, but nausea is constant. Pt has vomited multiple times today and has not been able to eat/drink anything.

## 2011-11-11 NOTE — ED Notes (Signed)
Per pt and spouse started having flank pain r/t to possible kidney stone last night around 11. Pain continues with nausea and vomiting - hx of frequent kidney stones.

## 2011-11-12 ENCOUNTER — Emergency Department (HOSPITAL_COMMUNITY): Payer: BC Managed Care – PPO

## 2011-11-12 MED ORDER — HYDROMORPHONE HCL PF 1 MG/ML IJ SOLN: 1.0000 mg | Freq: Once | INTRAMUSCULAR | Status: DC

## 2011-11-12 MED ORDER — IBUPROFEN 800 MG PO TABS: 800.0000 mg | ORAL_TABLET | Freq: Three times a day (TID) | ORAL | Status: AC

## 2011-11-12 MED ORDER — PROMETHAZINE HCL 25 MG PO TABS
25.0000 mg | ORAL_TABLET | Freq: Four times a day (QID) | ORAL | Status: DC | PRN
Start: 2011-11-12 — End: 2012-12-29

## 2011-11-12 NOTE — ED Provider Notes (Signed)
Results for orders placed during the hospital encounter of 11/11/11  URINALYSIS, ROUTINE W REFLEX MICROSCOPIC      Component Value Range   Color, Urine YELLOW  YELLOW   APPearance CLEAR  CLEAR   Specific Gravity, Urine 1.022  1.005 - 1.030   pH 5.5  5.0 - 8.0   Glucose, UA NEGATIVE  NEGATIVE mg/dL   Hgb urine dipstick NEGATIVE  NEGATIVE   Bilirubin Urine NEGATIVE  NEGATIVE   Ketones, ur NEGATIVE  NEGATIVE mg/dL   Protein, ur NEGATIVE  NEGATIVE mg/dL   Urobilinogen, UA 0.2  0.0 - 1.0 mg/dL   Nitrite NEGATIVE  NEGATIVE   Leukocytes, UA NEGATIVE  NEGATIVE  CBC WITH DIFFERENTIAL      Component Value Range   WBC 14.3 (*) 4.0 - 10.5 K/uL   RBC 5.20  4.22 - 5.81 MIL/uL   Hemoglobin 15.4  13.0 - 17.0 g/dL   HCT 29.5  62.1 - 30.8 %   MCV 83.3  78.0 - 100.0 fL   MCH 29.6  26.0 - 34.0 pg   MCHC 35.6  30.0 - 36.0 g/dL   RDW 65.7  84.6 - 96.2 %   Platelets 195  150 - 400 K/uL   Neutrophils Relative 84 (*) 43 - 77 %   Neutro Abs 12.1 (*) 1.7 - 7.7 K/uL   Lymphocytes Relative 8 (*) 12 - 46 %   Lymphs Abs 1.2  0.7 - 4.0 K/uL   Monocytes Relative 7  3 - 12 %   Monocytes Absolute 1.0  0.1 - 1.0 K/uL   Eosinophils Relative 0  0 - 5 %   Eosinophils Absolute 0.0  0.0 - 0.7 K/uL   Basophils Relative 0  0 - 1 %   Basophils Absolute 0.0  0.0 - 0.1 K/uL  COMPREHENSIVE METABOLIC PANEL      Component Value Range   Sodium 135  135 - 145 mEq/L   Potassium 3.7  3.5 - 5.1 mEq/L   Chloride 98  96 - 112 mEq/L   CO2 24  19 - 32 mEq/L   Glucose, Bld 141 (*) 70 - 99 mg/dL   BUN 14  6 - 23 mg/dL   Creatinine, Ser 9.52  0.50 - 1.35 mg/dL   Calcium 9.7  8.4 - 84.1 mg/dL   Total Protein 7.2  6.0 - 8.3 g/dL   Albumin 4.3  3.5 - 5.2 g/dL   AST 19  0 - 37 U/L   ALT 40  0 - 53 U/L   Alkaline Phosphatase 121 (*) 39 - 117 U/L   Total Bilirubin 0.9  0.3 - 1.2 mg/dL   GFR calc non Af Amer 62 (*) >90 mL/min   GFR calc Af Amer 72 (*) >90 mL/min   Ct Abdomen Pelvis Wo Contrast  11/12/2011  *RADIOLOGY REPORT*   Clinical Data: Right sided flank pain.  History of stones.  CT ABDOMEN AND PELVIS WITHOUT CONTRAST  Technique:  Multidetector CT imaging of the abdomen and pelvis was performed following the standard protocol without intravenous contrast.  Comparison: CT urogram 05/19/2009.  Findings: Atelectasis and scarring in the lung bases.  There is mild right-sided pyelocaliectasis and ureterectasis.  4 mm stone in the right base of the bladder could represent a ureteral stone in the ureterovesicle junction or recently passed stone into the bladder.  No bladder wall thickening.  Right pararenal stranding.  Additional nonobstructing stones in the right kidney, largest measuring 6 mm diameter.  No stone or  obstruction on the left.  The unenhanced appearance of the liver, spleen, pancreas, gallbladder, adrenal glands, abdominal aorta, and retroperitoneal lymph nodes is unremarkable.  Prominent visceral adipose tissues. No free fluid or free air in the abdomen.  The stomach, small bowel, and colon are decompressed.  Pelvis:  Prostate gland is not enlarged.  No free or loculated pelvic fluid collections.  No significant pelvic lymphadenopathy. Appendix is normal.  No diverticulitis.  Normal alignment of the lumbar vertebrae.  IMPRESSION: 4 mm stone in the right ureterovesicle junction versus base of the bladder.  Moderate proximal obstruction is suggested. Nonobstructing intrarenal stones on the right.   Original Report Authenticated By: Marlon Pel, M.D.     CT reviewed,PT evaluated, pain improving, plan d/c home with kidney stone precautions and RX pain meds and zofran.   Sunnie Nielsen, MD 11/12/11 [phone]

## 2011-11-14 NOTE — ED Provider Notes (Signed)
Medical screening examination/treatment/procedure(s) were conducted as a shared visit with non-physician practitioner(s) and myself.  I personally evaluated the patient during the encounter  See my note and eval.   Shelda Jakes, MD 11/14/11 2315

## 2012-09-07 ENCOUNTER — Other Ambulatory Visit: Payer: Self-pay

## 2012-09-07 MED ORDER — ESOMEPRAZOLE MAGNESIUM 40 MG PO CPDR: 40.0000 mg | DELAYED_RELEASE_CAPSULE | Freq: Every day | ORAL | Status: DC

## 2012-11-27 ENCOUNTER — Telehealth: Payer: Self-pay

## 2012-11-27 NOTE — Telephone Encounter (Signed)
Spoke to wife and she will have husband call us 11/30/12 and set up appointment and then we can refill his Nexium.

## 2012-11-30 ENCOUNTER — Telehealth: Payer: Self-pay | Admitting: Internal Medicine

## 2012-11-30 MED ORDER — ESOMEPRAZOLE MAGNESIUM 40 MG PO CPDR: 40.0000 mg | DELAYED_RELEASE_CAPSULE | Freq: Every day | ORAL | Status: DC

## 2012-11-30 NOTE — Telephone Encounter (Signed)
Rx sent in as requested and appointment made.

## 2012-12-29 ENCOUNTER — Encounter: Payer: Self-pay | Admitting: Internal Medicine

## 2012-12-29 ENCOUNTER — Ambulatory Visit (INDEPENDENT_AMBULATORY_CARE_PROVIDER_SITE_OTHER): Payer: BC Managed Care – PPO | Admitting: Internal Medicine

## 2012-12-29 VITALS — BP 124/80 | HR 100 | Ht 69.0 in | Wt 215.2 lb

## 2012-12-29 DIAGNOSIS — K219 Gastro-esophageal reflux disease without esophagitis: Secondary | ICD-10-CM

## 2012-12-29 MED ORDER — ESOMEPRAZOLE MAGNESIUM 40 MG PO CPDR: 40.0000 mg | DELAYED_RELEASE_CAPSULE | Freq: Every day | ORAL | Status: DC

## 2012-12-29 NOTE — Patient Instructions (Signed)
Please return as needed. I think Dr. Clelia Croft would be glad to refill the Nexium going forward. I am happy to do so if he won't.  It is the time of year to have a vaccination to prevent the flu (influenza virus).  Please have this done through your primary care provider or you can get this done at local pharmacies or the [facility]. It would be very helpful if you notify your primary care provider when and where you had the vaccination given by messaging them in My Chart, leaving a message or faxing the information.  I appreciate the opportunity to care for you. Iva Boop, MD, [facility]

## 2012-12-29 NOTE — Assessment & Plan Note (Addendum)
Refill esomeprazole We discussed need for chronic Tx - has hx erosive esophagitis + w/ FHx esophageal Ca makes sense - context He can get refills via Dr. Clelia Croft I suspect - does not need specialist visit for refills though I am willing to do. Also reviewed possible side effects of bone loss, enteric infection, B12 deficiency and very rare renal toxicity.

## 2012-12-30 ENCOUNTER — Encounter: Payer: Self-pay | Admitting: Internal Medicine

## 2012-12-30 NOTE — Progress Notes (Signed)
  Subjective:    Patient ID: Ryan Ray, male    DOB: Jul 04, 1960, 52 y.o.   MRN: [id number]  HPI The patient has a hx of GERD w/ erosive esophagitis. He does well on Nexium. He would like a refill. He still has 1-2 episodes of sudden onset of nausea and vomiting for up to a day over a period of 1 year. GI and imaging evaluations have not found a cause. He does not have a hx of migraines. He keeps abortive anti-emetics on hand at home. Allergies  Allergen Reactions  . Oxycodone-Acetaminophen Other (See Comments)     anxiety attack/hallucinations. Taking po dilaudid at home w/o problem   Outpatient Prescriptions Prior to Visit  Medication Sig Dispense Refill  . Tamsulosin HCl (FLOMAX) 0.4 MG CAPS Take 1 capsule (0.4 mg total) by mouth at bedtime.  14 capsule  0  . esomeprazole (NEXIUM) 40 MG capsule Take 1 capsule (40 mg total) by mouth daily.  90 capsule  0  . HYDROmorphone (DILAUDID) 2 MG tablet Take 1 tablet (2 mg total) by mouth every 6 (six) hours as needed for pain.  20 tablet  0  . ondansetron (ZOFRAN) 4 MG tablet Take 1 tablet (4 mg total) by mouth every 8 (eight) hours as needed for nausea.  20 tablet  0  . oxymetazoline (AFRIN) 0.05 % nasal spray Place 2 sprays into the nose daily as needed. For stuffy nose      . prochlorperazine (COMPAZINE) 25 MG suppository Place 1 suppository (25 mg total) rectally every 12 (twelve) hours as needed for nausea.  12 suppository  0  . promethazine (PHENERGAN) 25 MG tablet Take 1 tablet (25 mg total) by mouth every 6 (six) hours as needed for nausea.  30 tablet  0   No facility-administered medications prior to visit.   Past Medical History  Diagnosis Date  . GERD (gastroesophageal reflux disease)   . Diabetes mellitus     diet controlled  . History of kidney stones   . Hiatal hernia   . Sleep apnea   . External hemorrhoids without mention of complication   . Vomiting     chronic, intermittent and rare  Plantar fasciitis Past Surgical  History  Procedure Laterality Date  . Bilateral knee arthroscopy    . Colonoscopy  01/02/2006  . Esophagogastroduodenoscopy  05/13/2008  . Pilonidal cyst / sinus excision  2011   Review of Systems As per HPI    Objective:   Physical Exam WDWN NAD    Assessment & Plan:  GERD - Plan: esomeprazole (NEXIUM) 40 MG capsule

## 2013-01-20 ENCOUNTER — Other Ambulatory Visit: Payer: Self-pay | Admitting: Urology

## 2013-01-27 ENCOUNTER — Ambulatory Visit (HOSPITAL_BASED_OUTPATIENT_CLINIC_OR_DEPARTMENT_OTHER): Admit: 2013-01-27 | Payer: BC Managed Care – PPO | Admitting: Urology

## 2013-01-27 ENCOUNTER — Encounter (HOSPITAL_BASED_OUTPATIENT_CLINIC_OR_DEPARTMENT_OTHER): Payer: Self-pay

## 2013-01-27 SURGERY — CYSTOURETEROSCOPY, WITH RETROGRADE PYELOGRAM AND STENT INSERTION
Anesthesia: General | Laterality: Right

## 2013-08-02 ENCOUNTER — Telehealth: Payer: Self-pay | Admitting: Internal Medicine

## 2013-08-02 NOTE — Telephone Encounter (Signed)
Patient with epigastric pain and cramping after eating broccoli and cauliflower today.  He does report that he has intermittent pain and cramping in the epigastric area that lasts for several hours at a time.  He will come in and see Tye Savoy RNP tomorrow at 8:30

## 2013-08-03 ENCOUNTER — Ambulatory Visit (INDEPENDENT_AMBULATORY_CARE_PROVIDER_SITE_OTHER): Payer: BC Managed Care – PPO | Admitting: Nurse Practitioner

## 2013-08-03 ENCOUNTER — Encounter: Payer: Self-pay | Admitting: Nurse Practitioner

## 2013-08-03 ENCOUNTER — Other Ambulatory Visit (INDEPENDENT_AMBULATORY_CARE_PROVIDER_SITE_OTHER): Payer: BC Managed Care – PPO

## 2013-08-03 VITALS — BP 136/84 | HR 80 | Ht 70.0 in | Wt 213.0 lb

## 2013-08-03 DIAGNOSIS — R1013 Epigastric pain: Secondary | ICD-10-CM

## 2013-08-03 DIAGNOSIS — K219 Gastro-esophageal reflux disease without esophagitis: Secondary | ICD-10-CM

## 2013-08-03 LAB — LIPASE: LIPASE: 21 U/L (ref 11.0–59.0)

## 2013-08-03 LAB — HEPATIC FUNCTION PANEL
ALK PHOS: 87 U/L (ref 39–117)
ALT: 53 U/L (ref 0–53)
AST: 35 U/L (ref 0–37)
Albumin: 4.2 g/dL (ref 3.5–5.2)
BILIRUBIN DIRECT: 0.2 mg/dL (ref 0.0–0.3)
Total Bilirubin: 1.1 mg/dL (ref 0.2–1.2)
Total Protein: 7.1 g/dL (ref 6.0–8.3)

## 2013-08-03 LAB — AMYLASE: AMYLASE: 72 U/L (ref 27–131)

## 2013-08-03 MED ORDER — SUCRALFATE 1 GM/10ML PO SUSP: 1.0000 g | Freq: Three times a day (TID) | ORAL | Status: DC

## 2013-08-03 NOTE — Patient Instructions (Addendum)
You have been scheduled for an abdominal ultrasound at [facility] (1st floor of hospital) on 08-04-2013 at 930 am. Please arrive 15 minutes prior to your appointment for registration. Make certain not to have anything to eat or drink 6 hours prior to your appointment. Should you need to reschedule your appointment, please contact radiology at [phone]. This test typically takes about 30 minutes to perform.  Your physician has requested that you go to the basement for the following lab work before leaving today: Liver Function Panel Lipase  Amylase   We have sent the following medications to your pharmacy for you to pick up at your convenience: Carafate Liquid

## 2013-08-03 NOTE — Progress Notes (Signed)
     History of Present Illness:  Patient is a 53 year old male known to Dr. Carlean Purl for history of GERD / erosive esophagitis. He takes one Nexium daily which had been controlling heartburn up until about a month ago . Patient has a history of occasional nausea and vomiting. He comes in today for evaluation of epigastric pain radiating up into his chest. Patient has always been tender in his epigastrium but denies history of epigastric pain until about one month ago. He has noticed a correlation with fatty foods .   Current Medications, Allergies, Past Medical History, Past Surgical History, Family History and Social History were reviewed in [employer] record.   Physical Exam: General: Pleasant, well developed , white male in no acute distress Head: Normocephalic and atraumatic Eyes:  sclerae anicteric, conjunctiva pink  Ears: Normal auditory acuity Lungs: Clear throughout to auscultation Heart: Regular rate and rhythm Abdomen: Soft, non distended, mild epigastric tenderness.  No masses, no hepatomegaly. Normal bowel sounds Musculoskeletal: Symmetrical with no gross deformities  Extremities: No edema  Neurological: Alert oriented x 4, grossly nonfocal Psychological:  Alert and cooperative. Normal mood and affect  Assessment and Recommendations: 53 year old male with GERD/erosive esophagitis. He had been doing well on daily until about a month ago, now having breakthrough pyrosis. Patient started metformin a month ago, he wonders if it could be contributing to recurrent GERD symptoms. Continue daily Nexium, will try Carafate before meals.   Epigastric discomfort radiating into chest, present for one month but different than heartburn. No associated shortness of breath. Patient has noticed a relationship between the discomfort and consumption of fatty foods. He had an ultrasound in 2012 which was normal. I do not see any recent labs.  Will obtain LFTs, amylase,  lipase, and an abdominal ultrasound for further evaluation.

## 2013-08-04 ENCOUNTER — Ambulatory Visit (HOSPITAL_COMMUNITY)
Admission: RE | Admit: 2013-08-04 | Discharge: 2013-08-04 | Disposition: A | Discharge status: Home / Self Care | Payer: BC Managed Care – PPO | Source: Ambulatory Visit | Attending: Nurse Practitioner | Admitting: Nurse Practitioner

## 2013-08-04 DIAGNOSIS — K219 Gastro-esophageal reflux disease without esophagitis: Secondary | ICD-10-CM

## 2013-08-04 DIAGNOSIS — R1013 Epigastric pain: Secondary | ICD-10-CM

## 2013-08-04 DIAGNOSIS — K7689 Other specified diseases of liver: Secondary | ICD-10-CM | POA: Insufficient documentation

## 2013-08-05 ENCOUNTER — Telehealth: Payer: Self-pay | Admitting: Nurse Practitioner

## 2013-08-05 MED ORDER — SUCRALFATE 1 GM/10ML PO SUSP: 1.0000 g | Freq: Three times a day (TID) | ORAL | Status: DC

## 2013-08-05 NOTE — Telephone Encounter (Signed)
Rx sent to pharmacy as requested. Patient is asking for ultrasound results. Please, advise.

## 2013-08-06 ENCOUNTER — Telehealth: Payer: Self-pay | Admitting: Nurse Practitioner

## 2013-08-06 MED ORDER — HYOSCYAMINE SULFATE 0.125 MG SL SUBL: 0.1250 mg | SUBLINGUAL_TABLET | SUBLINGUAL | Status: DC | PRN

## 2013-08-06 NOTE — Telephone Encounter (Signed)
Patient notified of recommendation. 

## 2013-08-06 NOTE — Telephone Encounter (Signed)
Per Rollene Fare patient hasn't started carafate yet. Would like him to try the PPI / carafate regimen. Since going out of town he can try some Levsin SL as needed ( it may not help upper discomfort but can try it).

## 2013-08-06 NOTE — Telephone Encounter (Signed)
Patient notified of results and recommendations. He is going on trip soon and is asking what he can take if he has a "stomach spasm." Please, advise.

## 2013-08-09 NOTE — Telephone Encounter (Signed)
Called the pharmacy and advised to change the prescription to Carafate Tablets. Take 1 tab 4 times daily with meals and at bedtime.  Will call patient to advise.

## 2013-08-11 NOTE — Progress Notes (Signed)
Agree with Ms. Guenther's assessment and plan. Alazne Quant E. Sheana Bir, MD, FACG   

## 2013-09-07 ENCOUNTER — Encounter: Payer: BC Managed Care – PPO | Attending: Internal Medicine

## 2013-09-07 VITALS — Ht 70.0 in | Wt 210.0 lb

## 2013-09-07 DIAGNOSIS — E119 Type 2 diabetes mellitus without complications: Secondary | ICD-10-CM | POA: Insufficient documentation

## 2013-09-07 DIAGNOSIS — Z713 Dietary counseling and surveillance: Secondary | ICD-10-CM | POA: Insufficient documentation

## 2013-09-07 NOTE — Progress Notes (Signed)
Patient was seen on 09/07/13 for the first of a series of three diabetes self-management courses at the [facility].  Current HbA1c: 8.7%  The following learning objectives were met by the patient during this class:  Describe diabetes  State some common risk factors for diabetes  Defines the role of glucose and insulin  Identifies type of diabetes and pathophysiology  Describe the relationship between diabetes and cardiovascular risk  State the members of the Healthcare Team  States the rationale for glucose monitoring  State when to test glucose  State their individual Target Range  State the importance of logging glucose readings  Describe how to interpret glucose readings  Identifies A1C target  Explain the correlation between A1c and eAG values  State symptoms and treatment of high blood glucose  State symptoms and treatment of low blood glucose  Explain proper technique for glucose testing  Identifies proper sharps disposal  Handouts given during class include:  Living Well with Diabetes book  Carb Counting and Meal Planning book  Meal Plan Card  Carbohydrate guide  Meal planning worksheet  Low Sodium Flavoring Tips  The diabetes portion plate  B0W to eAG Conversion Chart  Diabetes Medications  Diabetes Recommended Care Schedule  Support Group  Diabetes Success Plan  Core Class Satisfaction Survey  Follow-Up Plan:  Attend core 2

## 2013-09-09 ENCOUNTER — Encounter: Payer: Self-pay | Admitting: Internal Medicine

## 2013-09-09 ENCOUNTER — Ambulatory Visit (INDEPENDENT_AMBULATORY_CARE_PROVIDER_SITE_OTHER): Payer: BC Managed Care – PPO | Admitting: Internal Medicine

## 2013-09-09 VITALS — BP 112/70 | HR 88 | Ht 69.25 in | Wt 207.5 lb

## 2013-09-09 DIAGNOSIS — IMO0001 Reserved for inherently not codable concepts without codable children: Secondary | ICD-10-CM

## 2013-09-09 DIAGNOSIS — IMO0002 Reserved for concepts with insufficient information to code with codable children: Secondary | ICD-10-CM

## 2013-09-09 DIAGNOSIS — E1165 Type 2 diabetes mellitus with hyperglycemia: Secondary | ICD-10-CM

## 2013-09-09 DIAGNOSIS — R1013 Epigastric pain: Secondary | ICD-10-CM

## 2013-09-09 MED ORDER — HYOSCYAMINE SULFATE 0.125 MG SL SUBL: 0.1250 mg | SUBLINGUAL_TABLET | SUBLINGUAL | Status: DC | PRN

## 2013-09-09 NOTE — Progress Notes (Signed)
   Subjective:    Patient ID: Ryan Ray, male    DOB: 1960/11/28, 53 y.o.   MRN: [id number]  HPI Is here for followup of upper abdominal pain. He saw Tye Savoy NP recently, for similar problems. I know him from previous visits, one problems he has had is episodic rare vomiting. He has not had any year. Please been having lately is epigastric pain radiating to the right upper quadrant. Not necessarily new. It is occurred after eating different foods, nothing in particular though the last 3 episodes have been associated with corn, and chicken both fried and baked.  This is been in the setting of uncontrolled diabetes. A hemoglobin A1c was 8.7. He has been trying to control his diabetes by diet but has been unsuccessful. He has had a diagnosis of type 2 diabetes for at least 3 years.  He had been prescribed Carafate but decided not to use that due to cost. He filled but has not used hyoscyamine sublingual.  When he gets his episodes of pain that may last for hours, he does seem to improve after defecation though he does not describe any constipation.  He had been taking his Nexium at night, he was taking his metformin in the morning and edema later. His pharmacist is told to take the Nexium in the morning weight 30 minutes take metformin and eat. He's not sure that's made any difference lightly but he did make that switched.  Medications, allergies, past medical history, past surgical history, family history and social history are reviewed and updated in the EMR.    Review of Systems As per history of present illness    Objective:   Physical Exam Well-developed well-nourished no acute distress The abdomen is soft and mildly tender in the epigastrium and right upper quadrant without mass. He does have a diastasis recti. There is no succussion splash.       Assessment & Plan:  Abdominal pain, epigastric Use the hyoscyamine RTC 2 months Work on DM control Next step if still a  problem CT abd IV contrast  Type 2 diabetes mellitus, uncontrolled I think this is part of problem   I appreciate the opportunity to care for this patient. CC: Ryan Redwood, MD

## 2013-09-09 NOTE — Assessment & Plan Note (Signed)
I think this is part of problem

## 2013-09-09 NOTE — Assessment & Plan Note (Signed)
Use the hyoscyamine RTC 2 months Work on DM control Next step if still a problem CT abd IV contrast

## 2013-09-09 NOTE — Patient Instructions (Addendum)
Continue to work on your diabetes.  Use your hyoscyamine as needed.   We will see you back 11/30/13 at 3:45pm or sooner if needed.   I appreciate the opportunity to care for you.

## 2013-09-14 DIAGNOSIS — E119 Type 2 diabetes mellitus without complications: Secondary | ICD-10-CM | POA: Diagnosis not present

## 2013-09-14 DIAGNOSIS — E1165 Type 2 diabetes mellitus with hyperglycemia: Secondary | ICD-10-CM

## 2013-09-14 DIAGNOSIS — IMO0002 Reserved for concepts with insufficient information to code with codable children: Secondary | ICD-10-CM

## 2013-09-14 NOTE — Progress Notes (Signed)

## 2013-09-21 DIAGNOSIS — IMO0002 Reserved for concepts with insufficient information to code with codable children: Secondary | ICD-10-CM

## 2013-09-21 DIAGNOSIS — E119 Type 2 diabetes mellitus without complications: Secondary | ICD-10-CM | POA: Diagnosis not present

## 2013-09-21 DIAGNOSIS — E1165 Type 2 diabetes mellitus with hyperglycemia: Secondary | ICD-10-CM

## 2013-09-21 NOTE — Progress Notes (Signed)
Patient was seen on 09/21/13 for the third of a series of three diabetes self-management courses at the [facility]. The following learning objectives were met by the patient during this class:    State the amount of activity recommended for healthy living   Describe activities suitable for individual needs   Identify ways to regularly incorporate activity into daily life   Identify barriers to activity and ways to over come these barriers  Identify diabetes medications being personally used and their primary action for lowering glucose and possible side effects   Describe role of stress on blood glucose and develop strategies to address psychosocial issues   Identify diabetes complications and ways to prevent them  Explain how to manage diabetes during illness   Evaluate success in meeting personal goal   Establish 2-3 goals that they will plan to diligently work on until they return for the  83-monthfollow-up visit  Goals:  Follow Diabetes Meal Plan as instructed  Aim for 15-30 mins of physical activity daily as tolerated  Bring food record and glucose log to your follow up visit  Your patient has established the following 4 month goals in their individualized success plan: I will count my carb choices at most meals and snacks I will test my glucose at least 1 times a day, 1 days a week  Your patient has identified these potential barriers to change:  None stated  Your patient has identified their diabetes self-care support plan as  [facility]  Plan:  Attend Core 4 in 4 months

## 2013-11-30 ENCOUNTER — Ambulatory Visit: Payer: BC Managed Care – PPO | Admitting: Internal Medicine

## 2013-12-03 ENCOUNTER — Encounter: Payer: Self-pay | Admitting: Internal Medicine

## 2013-12-03 NOTE — Progress Notes (Signed)
Patient ID: Ryan Ray, male   DOB: Mar 10, 1960, 53 y.o.   MRN: [id number] NO FOLLOW UP NECESSARY.

## 2014-02-07 ENCOUNTER — Other Ambulatory Visit: Payer: Self-pay | Admitting: Internal Medicine

## 2014-07-21 ENCOUNTER — Encounter: Payer: Self-pay | Admitting: Internal Medicine

## 2014-07-21 ENCOUNTER — Ambulatory Visit (INDEPENDENT_AMBULATORY_CARE_PROVIDER_SITE_OTHER): Payer: BLUE CROSS/BLUE SHIELD | Admitting: Internal Medicine

## 2014-07-21 VITALS — BP 112/82 | HR 88 | Ht 69.25 in | Wt 210.5 lb

## 2014-07-21 DIAGNOSIS — R1010 Upper abdominal pain, unspecified: Secondary | ICD-10-CM

## 2014-07-21 NOTE — Patient Instructions (Signed)
   Please call me back and tell the staff that you need to let me know that your pain has returned - if it returns. We can then decide to order tests then without seeing me, most likely.  I appreciate the opportunity to care for you. Gatha Mayer, MD, [facility]

## 2014-07-21 NOTE — Progress Notes (Signed)
   Subjective:    Patient ID: Ryan Ray, male    DOB: 12-Oct-1960, 54 y.o.   MRN: [id number] Cc: abdominal pain HPI  Recurrent sxs of abdominal pain occurred a month or so ago. The last several weeks. He had discomfort in the epigastrium and right upper quadrant, some radiation to the back. It seemed to occur after eating but not always and was more of a pressure sensation. No nausea or vomiting or bowel habit changes. He said the pain then radiated or moved over to the left upper quadrant and then eventually subsided. It started about 3 months into metformin therapy. He did not take high ostomy which is on his medication list. He feels well now. Some cancer fear is mentioned. Medications, allergies, past medical history, past surgical history, family history and social history are reviewed and updated in the EMR.   Review of Systems As above    Objective:   Physical Exam @BP  112/82 mmHg  Pulse 88  Ht 5' 9.25" (1.759 m)  Wt 210 lb 8 oz (95.482 kg)  BMI 30.86 kg/m2@  General:  NAD Eyes:   anicteric Abdomen:  soft and nontender, BS+ xiphoid and ribs are nontender no organomegaly or mass bowel sounds are present     Assessment & Plan:   1. Upper abdominal pain    he has chronic recurrent abdominal symptoms. Imaging studies with ultrasound last year and previous endoscopic evaluations did not reveal the cause. I'm not sure what happened this time but since he is not symptomatic now would not study anything. No worrisome features like unintentional weight loss or dysphagia or bleeding are reported. I have asked him to call back and let me know if this recurs, and we would most likely consider a CT of the abdomen and pelvis with IV contrast.  15 minutes time spent with patient > half in counseling coordination of care  I appreciate the opportunity to care for this patient. CC: [name], MD

## 2014-08-02 ENCOUNTER — Telehealth: Payer: Self-pay | Admitting: Internal Medicine

## 2014-08-02 DIAGNOSIS — R1010 Upper abdominal pain, unspecified: Secondary | ICD-10-CM

## 2014-08-02 NOTE — Telephone Encounter (Signed)
Patient notified of recommendations He is scheduled for CT scan abdomen and pelvis for 08/08/14 3:30 at [facility].  He is notified to come pick up instructions and lab work by Friday.   He verbalized understanding of instructions.

## 2014-08-02 NOTE — Telephone Encounter (Signed)
Patient has continued abdominal pain.  Your note from 07/21/14 states you are going to consider CT scan.  Please advise

## 2014-08-02 NOTE — Telephone Encounter (Signed)
CT abd/pelvis with oral and IV contrast re: epigastric pain

## 2014-08-03 ENCOUNTER — Other Ambulatory Visit (INDEPENDENT_AMBULATORY_CARE_PROVIDER_SITE_OTHER): Payer: BLUE CROSS/BLUE SHIELD

## 2014-08-03 DIAGNOSIS — R1010 Upper abdominal pain, unspecified: Secondary | ICD-10-CM

## 2014-08-03 LAB — BUN: BUN: 9 mg/dL (ref 6–23)

## 2014-08-03 LAB — CREATININE, SERUM: Creatinine, Ser: 1.05 mg/dL (ref 0.40–1.50)

## 2014-08-08 ENCOUNTER — Ambulatory Visit (INDEPENDENT_AMBULATORY_CARE_PROVIDER_SITE_OTHER)
Admission: RE | Admit: 2014-08-08 | Discharge: 2014-08-08 | Disposition: A | Discharge status: Home / Self Care | Payer: BLUE CROSS/BLUE SHIELD | Source: Ambulatory Visit | Attending: Internal Medicine | Admitting: Internal Medicine

## 2014-08-08 DIAGNOSIS — R1010 Upper abdominal pain, unspecified: Secondary | ICD-10-CM

## 2014-08-08 MED ORDER — IOHEXOL 300 MG/ML  SOLN
100.0000 mL | Freq: Once | INTRAMUSCULAR | Status: AC | PRN
  Administered 2014-08-08: 100 mL via INTRAVENOUS

## 2014-08-09 ENCOUNTER — Telehealth: Payer: Self-pay | Admitting: Internal Medicine

## 2014-08-09 ENCOUNTER — Other Ambulatory Visit: Payer: Self-pay

## 2014-08-09 MED ORDER — MELOXICAM 15 MG PO TABS: 15.0000 mg | ORAL_TABLET | Freq: Every day | ORAL | Status: DC

## 2014-08-09 NOTE — Progress Notes (Signed)
Quick Note:  CT shows chronic inflammatory changes in the internal abdominal fat pad I want him to start meloxicam 15 mg daily # 30 1 RF and see me in 6- 8 weeks ______

## 2014-08-09 NOTE — Telephone Encounter (Signed)
Patient reports that there are no acute findings on CT that we will call back when Dr. Carlean Purl has the chance to review and comment when he returns next week

## 2014-09-02 ENCOUNTER — Telehealth: Payer: Self-pay | Admitting: Internal Medicine

## 2014-09-02 NOTE — Telephone Encounter (Signed)
Pt will call with an update and if symptoms worsen or he stops passing any stool or gas he will go to the ER or call the on MD

## 2014-09-02 NOTE — Telephone Encounter (Signed)
Correction - had the CT in early June Try the Miralax as suggested Call back if not better 23-48 hrs (on call MD)

## 2014-09-02 NOTE — Telephone Encounter (Signed)
Please have him get a CT abd/pelvis today with contrast  Reason would be suspected small bowel obstruction - can use that as dx (SBO)

## 2014-09-02 NOTE — Telephone Encounter (Signed)
Pt has begun to have abd pains and at the center of abd under the rib cage.  Have had 2-3 small bowel movements today.  Hard stool.  Pt will try miralax and I will forward to Dr Carlean Purl for further recommendations.

## 2014-09-08 ENCOUNTER — Encounter: Payer: Self-pay | Admitting: Internal Medicine

## 2014-09-08 NOTE — Progress Notes (Signed)
Patient ID: Ryan Ray, male   DOB: Apr 08, 1960, 54 y.o.   MRN: [id number]   Faxed 07/21/14 office note, recent labs and imaging to [facility] per their request to have in patient's file there.

## 2014-10-17 ENCOUNTER — Encounter: Payer: Self-pay | Admitting: Internal Medicine

## 2014-10-17 ENCOUNTER — Ambulatory Visit (INDEPENDENT_AMBULATORY_CARE_PROVIDER_SITE_OTHER): Payer: BLUE CROSS/BLUE SHIELD | Admitting: Internal Medicine

## 2014-10-17 VITALS — BP 112/82 | HR 72 | Ht 69.25 in | Wt 208.2 lb

## 2014-10-17 DIAGNOSIS — R1011 Right upper quadrant pain: Secondary | ICD-10-CM

## 2014-10-17 DIAGNOSIS — G43A0 Cyclical vomiting, not intractable: Secondary | ICD-10-CM

## 2014-10-17 DIAGNOSIS — R1115 Cyclical vomiting syndrome unrelated to migraine: Secondary | ICD-10-CM

## 2014-10-17 NOTE — Patient Instructions (Signed)
Please read the information given to you about the HIDA scan.   Thank you for the opportunity to participate in your care.

## 2014-10-17 NOTE — Progress Notes (Signed)
   Subjective:    Patient ID: Ryan Ray, male    DOB: 01-22-1961, 54 y.o.   MRN: [id number] Cc: abdominal pains and blockages HPI meloxicam no help 2 episodes bloating, RUQ painand abdominal distress  since I saw him last Medications, allergies, past medical history, past surgical history, family history and social history are reviewed and updated in the EMR.  Review of Systems As above    Objective:   Physical Exam  BP 112/82 mmHg  Pulse 72  Ht 5' 9.25" (1.759 m)  Wt 208 lb 4 oz (94.462 kg)  BMI 30.53 kg/m2 NAD    Assessment & Plan:  Right upper quadrant abdominal pain  Non-intractable cyclical vomiting with nausea  Cause of his problems are not clear at all - negative labs and CT scans ? Occult GB dz He will think about HIDA and let me know as I have suggested he could try that to see.  I appreciate the opportunity to care for this patient. ZD:GUYQ, Gwyndolyn Saxon, MD 15 minutes time spent with patient > half in counseling coordination of care

## 2017-02-07 ENCOUNTER — Emergency Department (HOSPITAL_COMMUNITY)
Admission: EM | Admit: 2017-02-07 | Discharge: 2017-02-07 | Disposition: A | Discharge status: Home / Self Care | Payer: 59 | Attending: Emergency Medicine | Admitting: Emergency Medicine

## 2017-02-07 ENCOUNTER — Emergency Department (HOSPITAL_COMMUNITY): Payer: 59

## 2017-02-07 ENCOUNTER — Encounter (HOSPITAL_COMMUNITY): Payer: Self-pay

## 2017-02-07 ENCOUNTER — Other Ambulatory Visit: Payer: Self-pay

## 2017-02-07 DIAGNOSIS — R109 Unspecified abdominal pain: Secondary | ICD-10-CM | POA: Diagnosis present

## 2017-02-07 DIAGNOSIS — N23 Unspecified renal colic: Secondary | ICD-10-CM

## 2017-02-07 DIAGNOSIS — Z7984 Long term (current) use of oral hypoglycemic drugs: Secondary | ICD-10-CM | POA: Insufficient documentation

## 2017-02-07 DIAGNOSIS — E119 Type 2 diabetes mellitus without complications: Secondary | ICD-10-CM | POA: Diagnosis not present

## 2017-02-07 DIAGNOSIS — Z794 Long term (current) use of insulin: Secondary | ICD-10-CM | POA: Insufficient documentation

## 2017-02-07 DIAGNOSIS — Z87891 Personal history of nicotine dependence: Secondary | ICD-10-CM | POA: Diagnosis not present

## 2017-02-07 HISTORY — DX: Disorder of kidney and ureter, unspecified: N28.9

## 2017-02-07 LAB — CBC WITH DIFFERENTIAL/PLATELET
BASOS ABS: 0 10*3/uL (ref 0.0–0.1)
Basophils Relative: 0 %
Eosinophils Absolute: 0 10*3/uL (ref 0.0–0.7)
Eosinophils Relative: 0 %
HEMATOCRIT: 41.1 % (ref 39.0–52.0)
HEMOGLOBIN: 15.2 g/dL (ref 13.0–17.0)
LYMPHS PCT: 8 %
Lymphs Abs: 1.1 10*3/uL (ref 0.7–4.0)
MCH: 29.6 pg (ref 26.0–34.0)
MCHC: 37 g/dL — ABNORMAL HIGH (ref 30.0–36.0)
MCV: 80 fL (ref 78.0–100.0)
Monocytes Absolute: 0.6 10*3/uL (ref 0.1–1.0)
Monocytes Relative: 5 %
NEUTROS ABS: 11.8 10*3/uL — AB (ref 1.7–7.7)
NEUTROS PCT: 87 %
Platelets: 207 10*3/uL (ref 150–400)
RBC: 5.14 MIL/uL (ref 4.22–5.81)
RDW: 12.4 % (ref 11.5–15.5)
WBC: 13.6 10*3/uL — AB (ref 4.0–10.5)

## 2017-02-07 LAB — COMPREHENSIVE METABOLIC PANEL
ALT: 47 U/L (ref 17–63)
AST: 32 U/L (ref 15–41)
Albumin: 4 g/dL (ref 3.5–5.0)
Alkaline Phosphatase: 87 U/L (ref 38–126)
Anion gap: 9 (ref 5–15)
BILIRUBIN TOTAL: 1.3 mg/dL — AB (ref 0.3–1.2)
BUN: 13 mg/dL (ref 6–20)
CO2: 23 mmol/L (ref 22–32)
CREATININE: 1.31 mg/dL — AB (ref 0.61–1.24)
Calcium: 8.7 mg/dL — ABNORMAL LOW (ref 8.9–10.3)
Chloride: 99 mmol/L — ABNORMAL LOW (ref 101–111)
GFR calc Af Amer: >60 mL/min (ref >60)
GLUCOSE: 256 mg/dL — AB (ref 65–99)
Potassium: 3.7 mmol/L (ref 3.5–5.1)
Sodium: 131 mmol/L — ABNORMAL LOW (ref 135–145)
TOTAL PROTEIN: 6.8 g/dL (ref 6.5–8.1)

## 2017-02-07 LAB — URINALYSIS, ROUTINE W REFLEX MICROSCOPIC
Bilirubin Urine: NEGATIVE
Ketones, ur: 20 mg/dL — AB
Leukocytes, UA: NEGATIVE
NITRITE: NEGATIVE
PROTEIN: NEGATIVE mg/dL
Specific Gravity, Urine: 1.039 — ABNORMAL HIGH (ref 1.005–1.030)
Squamous Epithelial / LPF: NONE SEEN
pH: 5 (ref 5.0–8.0)

## 2017-02-07 LAB — LIPASE, BLOOD: LIPASE: 36 U/L (ref 11–51)

## 2017-02-07 MED ORDER — MORPHINE SULFATE (PF) 4 MG/ML IV SOLN
4.0000 mg | Freq: Once | INTRAVENOUS | Status: AC
  Administered 2017-02-07: 4 mg via INTRAVENOUS
  Filled 2017-02-07: qty 1

## 2017-02-07 MED ORDER — ONDANSETRON 4 MG PO TBDP: ORAL_TABLET | ORAL | 0 refills | Status: DC

## 2017-02-07 MED ORDER — TAMSULOSIN HCL 0.4 MG PO CAPS: 0.4000 mg | ORAL_CAPSULE | Freq: Every day | ORAL | 0 refills | Status: DC

## 2017-02-07 MED ORDER — HYDROMORPHONE HCL 4 MG PO TABS: 4.0000 mg | ORAL_TABLET | ORAL | 0 refills | Status: DC | PRN

## 2017-02-07 MED ORDER — SODIUM CHLORIDE 0.9 % IV BOLUS (SEPSIS)
1000.0000 mL | Freq: Once | INTRAVENOUS | Status: AC
  Administered 2017-02-07: 1000 mL via INTRAVENOUS

## 2017-02-07 MED ORDER — ONDANSETRON 4 MG PO TBDP
4.0000 mg | ORAL_TABLET | Freq: Once | ORAL | Status: AC
  Administered 2017-02-07: 4 mg via ORAL
  Filled 2017-02-07: qty 1

## 2017-02-07 MED ORDER — IBUPROFEN 600 MG PO TABS: 600.0000 mg | ORAL_TABLET | Freq: Four times a day (QID) | ORAL | 0 refills | Status: DC | PRN

## 2017-02-07 MED ORDER — ONDANSETRON 4 MG PO TBDP
4.0000 mg | ORAL_TABLET | Freq: Once | ORAL | Status: AC | PRN
Start: 2017-02-07 — End: 2017-02-07
  Administered 2017-02-07: 4 mg via ORAL
  Filled 2017-02-07: qty 1

## 2017-02-07 MED ORDER — KETOROLAC TROMETHAMINE 30 MG/ML IJ SOLN
30.0000 mg | Freq: Once | INTRAMUSCULAR | Status: AC
  Administered 2017-02-07: 30 mg via INTRAVENOUS
  Filled 2017-02-07: qty 1

## 2017-02-07 MED ORDER — HYDROMORPHONE HCL 2 MG PO TABS
4.0000 mg | ORAL_TABLET | Freq: Once | ORAL | Status: DC
  Filled 2017-02-07: qty 2

## 2017-02-07 NOTE — ED Notes (Addendum)
Discharge instructions reviewed with patient. Patient verbalizes understanding. VSS.  Patient feeling very nauseated and vomiting prior to discharge. Dr Darl Householder aware.

## 2017-02-07 NOTE — Discharge Instructions (Signed)
Take motrin for pain.   Stay hydrated.  Take flomax daily.   Take dilaudid as needed for pain. Take zofran if you get nauseated. Don't drive with dilaudid.   See urologist for follow up   Return to ER if you have severe flank pain, abdominal pain, vomiting, fever, trouble urinating.

## 2017-02-07 NOTE — ED Triage Notes (Signed)
Patient reports a history of kidney stones and states he has been told he has stones currently. Patient c/o left flank pain and nausea since 0630 today.

## 2017-02-07 NOTE — ED Notes (Signed)
Patient transported to x-ray. ?

## 2017-02-07 NOTE — ED Provider Notes (Signed)
[facility] DEPT Provider Note   CSN: [id number] Arrival date & time: 02/07/17  0901     History   Chief Complaint Chief Complaint  Patient presents with  . Nephrolithiasis  . Flank Pain  . Nausea    HPI Ryan Ray is a 56 y.o. male history diabetes, previous kidney stone who presented with left flank pain.  Patient has acute onset of left flank pain radiated to the left groin that started around 6:30 AM.  He was getting ready to go to work and then felt the pain as sharp and crampy.  Associated with nausea.  He noticed that his urine is darker than usual but denies any frank hematuria or dysuria.  Patient had a kidney stone about 4 years ago and was able to pass it on his own and did not require any surgery.  The history is provided by the patient.    Past Medical History:  Diagnosis Date  . Diabetes mellitus   . External hemorrhoids without mention of complication   . GERD (gastroesophageal reflux disease)   . Hiatal hernia   . History of kidney stones   . Hyperplastic colon polyp 08/01/2011  . PLANTAR FASCIITIS, RIGHT 03/28/2009   Qualifier: Diagnosis of  By: Jerold Coombe    . Renal disorder   . Sleep apnea   . Vomiting    chronic, intermittent and rare    Patient Active Problem List   Diagnosis Date Noted  . Type 2 diabetes mellitus, uncontrolled ([place]) 09/09/2013  . Abdominal pain, epigastric 08/03/2013  . VENTRAL HERNIA 09/18/2009  . OTHER AND UNSPECIFIED HYPERLIPIDEMIA 12/30/2007  . ALLERGIC RHINITIS 12/30/2007  . LOW BACK PAIN, CHRONIC 12/30/2007  . OBESITY 11/23/2007  . GERD 11/23/2007  . OBSTRUCTIVE SLEEP APNEA 03/11/2007  . 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 BILATERAL KNEE ARTHROSCOPY    . COLONOSCOPY  01/02/2006   multiple  . ESOPHAGOGASTRODUODENOSCOPY  05/13/2008   multiple   . PILONIDAL CYST / SINUS EXCISION  2011         Home Medications    Prior to Admission medications   Medication Sig Start Date End Date Taking? Authorizing Provider  ibuprofen (ADVIL,MOTRIN) 200 MG tablet Take 600 mg by mouth daily as needed for moderate pain.   Yes [provider]  metformin (FORTAMET) 1000 MG (OSM) 24 hr tablet Take 1,000 mg by mouth daily. 02/04/17  Yes [provider]  NEXIUM 40 MG capsule TAKE 1 BY MOUTH DAILY 02/07/14  Yes Gatha Mayer, MD  Tamsulosin HCl (FLOMAX) 0.4 MG CAPS Take 1 capsule (0.4 mg total) by mouth at bedtime. 11/11/11  Yes Melynda Ripple, MD  TRULICITY 1.5 YQ/6.5HQ SOPN Inject 1.5 mg into the skin once a week. 01/20/17  Yes [provider]    Family History Family History  Problem Relation Age of Onset  . Crohn's disease Mother   . Diabetes Father   . Crohn's disease Brother   . Colon cancer Neg Hx     Social History Social History   Tobacco Use  . Smoking status: Never Smoker  . Smokeless tobacco: Former Systems developer    Types: Chew  Substance Use Topics  . Alcohol use: No  . Drug use: No     Allergies   Oxycodone-acetaminophen   Review of Systems Review of Systems  Genitourinary: Positive for flank pain.  All other systems reviewed and  are negative.    Physical Exam Updated Vital Signs BP 123/84   Pulse 76   Temp 98 F (36.7 C) (Oral)   Resp 18   Ht 5\' 10"  (1.778 m)   Wt 91.6 kg (202 lb)   SpO2 97%   BMI 28.98 kg/m   Physical Exam  Constitutional: He is oriented to person, place, and time.  Uncomfortable   HENT:  Head: Normocephalic.  Mouth/Throat: Oropharynx is clear and moist.  Eyes: Conjunctivae and EOM are normal. Pupils are equal, round, and reactive to light.  Neck: Normal range of motion. Neck supple.  Cardiovascular: Normal rate, regular rhythm and normal heart sounds.  Pulmonary/Chest: Effort normal and breath sounds normal. No stridor. No respiratory distress. He has no wheezes.  Abdominal: Soft. Bowel sounds are  normal.  + L CVAT, mild LLQ tenderness   Musculoskeletal: Normal range of motion.  Neurological: He is alert and oriented to person, place, and time. No cranial nerve deficit. Coordination normal.  Skin: Skin is warm.  Psychiatric: He has a normal mood and affect.  Nursing note and vitals reviewed.    ED Treatments / Results  Labs (all labs ordered are listed, but only abnormal results are displayed) Labs Reviewed  URINALYSIS, ROUTINE W REFLEX MICROSCOPIC - Abnormal; Notable for the following components:      Result Value   Specific Gravity, Urine 1.039 (*)    Glucose, UA >=500 (*)    Hgb urine dipstick MODERATE (*)    Ketones, ur 20 (*)    Bacteria, UA RARE (*)    All other components within normal limits  CBC WITH DIFFERENTIAL/PLATELET - Abnormal; Notable for the following components:   WBC 13.6 (*)    MCHC 37.0 (*)    Neutro Abs 11.8 (*)    All other components within normal limits  COMPREHENSIVE METABOLIC PANEL - Abnormal; Notable for the following components:   Sodium 131 (*)    Chloride 99 (*)    Glucose, Bld 256 (*)    Creatinine, Ser 1.31 (*)    Calcium 8.7 (*)    Total Bilirubin 1.3 (*)    All other components within normal limits  URINE CULTURE  LIPASE, BLOOD    EKG  EKG Interpretation None       Radiology Dg Abdomen 1 View  Result Date: 02/07/2017 CLINICAL DATA:  Left lower abdominal pain since yesterday. EXAM: ABDOMEN - 1 VIEW COMPARISON:  CT scan 08/07/2016 FINDINGS: No gaseous bowel dilatation. Phleboliths are seen over the left anatomic pelvis, but no unexpected abdominopelvic calcification. Visualized bony anatomy is unremarkable. IMPRESSION: Negative. Electronically Signed   By: Misty Stanley M.D.   On: 02/07/2017 12:35   US Renal  Result Date: 02/07/2017 CLINICAL DATA:  Initial evaluation for acute left flank pain. EXAM: RENAL / URINARY TRACT ULTRASOUND COMPLETE COMPARISON:  Prior radiograph from earlier the same day. FINDINGS: Right Kidney:  Length: 11.2 cm. Echogenicity within normal limits. No mass or hydronephrosis visualized. No shadowing echogenic stone. Left Kidney: Length: 11.8 cm. Echogenicity within normal limits. No mass or hydronephrosis visualized. No shadowing echogenic stone. Trace left perinephric free fluid. Bladder: Appears normal for degree of bladder distention. Enlarged prostate. IMPRESSION: 1. No sonographic evidence for nephrolithiasis or hydronephrosis. 2. Trace left perinephric free fluid. 3. Enlarged prostate. Electronically Signed   By: Jeannine Boga M.D.   On: 02/07/2017 13:04    Procedures Procedures (including critical care time)  Medications Ordered in ED Medications  ondansetron (ZOFRAN-ODT) disintegrating tablet 4  mg (4 mg Oral Given 02/07/17 0959)  sodium chloride 0.9 % bolus 1,000 mL (0 mLs Intravenous Stopped 02/07/17 1331)  morphine 4 MG/ML injection 4 mg (4 mg Intravenous Given 02/07/17 1219)  ketorolac (TORADOL) 30 MG/ML injection 30 mg (30 mg Intravenous Given 02/07/17 1220)     Initial Impression / Assessment and Plan / ED Course  I have reviewed the triage vital signs and the nursing notes.  Pertinent labs & imaging results that were available during my care of the patient were reviewed by me and considered in my medical decision making (see chart for details).    Ryan Ray is a 55 y.o. male hx of kidney stone here with L flank pain. Likely renal colic. Will get labs, xray, US renal, UA. Will hydrate and give pain meds and reassess.   2:16 PM UA showed blood, but no UTI. Xray showed no obvious stone. US showed no hydro, just trace L perinephric fluid. I suspect ruptured calyxes from renal colic. WBC nl. Afebrile, well appearing. Pain controlled with pain meds. Cr slightly elevated and was given IVF. Will dc home with flomax, motrin, dilaudid prn (had tolerated it in the past), urology follow up.   Final Clinical Impressions(s) / ED Diagnoses   Final diagnoses:  None     ED Discharge Orders    None       Drenda Freeze, MD 02/07/17 1418

## 2017-02-08 LAB — URINE CULTURE: CULTURE: NO GROWTH

## 2018-12-24 ENCOUNTER — Other Ambulatory Visit: Payer: Self-pay | Admitting: Internal Medicine

## 2018-12-24 DIAGNOSIS — R1011 Right upper quadrant pain: Secondary | ICD-10-CM

## 2018-12-24 LAB — IFOBT (OCCULT BLOOD): IFOBT: POSITIVE

## 2018-12-31 ENCOUNTER — Ambulatory Visit
Admission: RE | Admit: 2018-12-31 | Discharge: 2018-12-31 | Disposition: A | Discharge status: Home / Self Care | Payer: 59 | Source: Ambulatory Visit | Attending: Internal Medicine | Admitting: Internal Medicine

## 2018-12-31 DIAGNOSIS — R1011 Right upper quadrant pain: Secondary | ICD-10-CM

## 2019-02-16 ENCOUNTER — Ambulatory Visit (INDEPENDENT_AMBULATORY_CARE_PROVIDER_SITE_OTHER): Payer: 59 | Admitting: Internal Medicine

## 2019-02-16 ENCOUNTER — Other Ambulatory Visit (INDEPENDENT_AMBULATORY_CARE_PROVIDER_SITE_OTHER): Payer: 59

## 2019-02-16 ENCOUNTER — Encounter: Payer: Self-pay | Admitting: Internal Medicine

## 2019-02-16 VITALS — BP 124/70 | HR 96 | Temp 97.6°F | Ht 70.0 in | Wt 196.6 lb

## 2019-02-16 DIAGNOSIS — R195 Other fecal abnormalities: Secondary | ICD-10-CM | POA: Diagnosis not present

## 2019-02-16 DIAGNOSIS — R1013 Epigastric pain: Secondary | ICD-10-CM | POA: Diagnosis not present

## 2019-02-16 DIAGNOSIS — K8020 Calculus of gallbladder without cholecystitis without obstruction: Secondary | ICD-10-CM

## 2019-02-16 DIAGNOSIS — K625 Hemorrhage of anus and rectum: Secondary | ICD-10-CM | POA: Diagnosis not present

## 2019-02-16 DIAGNOSIS — K760 Fatty (change of) liver, not elsewhere classified: Secondary | ICD-10-CM | POA: Diagnosis not present

## 2019-02-16 LAB — IGA: IgA: 232 mg/dL (ref 68–378)

## 2019-02-16 NOTE — Progress Notes (Signed)
Ryan Ray 58 y.o. 12-18-1960 [id number] Referred by: Ryan Redwood, MD  Assessment & Plan:   Encounter Diagnoses  Name Primary?  . Calculus of gallbladder without cholecystitis without obstruction Yes  . Heme + stool-iFOBT   . Abdominal pain, epigastric   . Rectal bleeding   . NAFLD (nonalcoholic fatty liver disease)    So he has had years of an upper abdominal pain.  He has never had gallstones before but now he appears to have stones in the cystic duct.  I reviewed the ultrasound he could need an MRCP.  Question I have is is the location of stones amenable to removal with cholecystectomy.  Will need a surgical referral and he needs cardiology clearance prior to any type of procedures which would include colonoscopy to investigate the I FOBT positive stool. Needs endoscopic evaluation but need to wait for cardiology recommendations. Orders Placed This Encounter  Procedures  . Tissue transglutaminase, IgA  . IgA   Rule out celiac disease given fatty liver.  We will contact the patient with these lab results and try to determine when he might be able to do colonoscopy.  I appreciate the opportunity to care for this patient.  [id number], Ryan Saxon, MD  Subjective:   Chief Complaint: Heme positive stool  HPI Patient is here because of a heme positive stool detected on routine testing at his primary care office.  He does say he was having some bright red blood per rectum and on the toilet paper intermittently prior to this note that his last colonoscopy in 2013 showed hyperplastic polyp.  Plans for 20 only 3-year recall.Marland Kitchen  He also has a persistent right upper quadrant pain that he has had for years and had previously undergone ultrasound HIDA scan and other imaging work-up and EGD which was unrevealing.  He says he gets an epigastric pain where he feels like he has a "blockage".  And he will often have intensification of the pain and nausea progressing in the vomiting.  This is  episodic and is not entirely clear related to different types of foods but is often postprandial.  He is waiting to see a cardiologist because of some chest pressures that he had, atypical it sounds like by cardiology appointment was recommended.  His wife thinks it is related to stress.  He was seen in the emergency department with a negative ED evaluation for chest pain and referred to cardiology who is not yet made that appointment.  He has belching and heartburn symptoms at times.  Weight is going down I think that is intentional.  Primary care records are reviewed in addition to the heme positive stool CBC normal.  Bilirubin 1.6 with LFTs otherwise normal.  TSH PSA normal.  Hemoglobin A1c 9.1 in October.  Treatment options limited by his high deductible plan.   As part of his work-up recently an ultrasound of the abdomen limited right upper quadrant December 31, 2018 showed cholecystolithiasis but not describes further.  No gallstones.  Decreased echogenicity of the liver suspicious for fatty liver. Allergies  Allergen Reactions  . Oxycodone-Acetaminophen Other (See Comments)     anxiety attack/hallucinations. Taking po dilaudid at home w/o problem   Current Meds  Medication Sig  . glipiZIDE (GLUCOTROL) 5 MG tablet Take 5 mg by mouth 2 (two) times daily before a meal.  . metformin (FORTAMET) 1000 MG (OSM) 24 hr tablet Take 1,000 mg by mouth 2 (two) times daily with a meal.   . pantoprazole (PROTONIX) 40  MG tablet Take 40 mg by mouth 2 (two) times daily. Pt to take BID for 1 week then decrease to QD - PCP  . rosuvastatin (CRESTOR) 10 MG tablet Take 10 mg by mouth at bedtime.  . Tamsulosin HCl (FLOMAX) 0.4 MG CAPS Take 1 capsule (0.4 mg total) by mouth at bedtime.   Past Medical History:  Diagnosis Date  . BPH (benign prostatic hyperplasia)   . Diabetes mellitus   . Dyslipidemia   . External hemorrhoids without mention of complication   . GERD (gastroesophageal reflux disease)   . Hiatal  hernia   . History of kidney stones   . Hyperplastic colon polyp 08/01/2011  . Metabolic syndrome   . PLANTAR FASCIITIS, RIGHT 03/28/2009   Qualifier: Diagnosis of  By: Jerold Coombe    . Renal disorder   . Sleep apnea   . Vomiting    chronic, intermittent and rare   Past Surgical History:  Procedure Laterality Date  . BILATERAL KNEE ARTHROSCOPY    . COLONOSCOPY  01/02/2006   multiple  . ESOPHAGOGASTRODUODENOSCOPY  05/13/2008   multiple   . PILONIDAL CYST / SINUS EXCISION  2011  . UPPER GASTROINTESTINAL ENDOSCOPY     Social History   Social History Narrative   Married 2 sons 1 born 69 on board 2004.  He is employed in [employer].  He works for a [employer].   family history includes Crohn's disease in his brother and mother; Diabetes in his father; Esophageal cancer in his father; Stomach cancer in his father.   Review of Systems As per HPI.  Back pain anxious mood fatigue headaches muscle pains allergy and sinus symptoms intermittently.  All other review of systems appear negative.  Objective:   Physical Exam @BP  124/70   Pulse 96   Temp 97.6 F (36.4 C)   Ht 5\' 10"  (1.778 m)   Wt 196 lb 9.6 oz (89.2 kg)   SpO2 100%   BMI 28.21 kg/m @  General:  Well-developed, well-nourished and in no acute distress Eyes:  anicteric. ENT:   Mouth and posterior pharynx free of lesions.  Neck:   supple w/o thyromegaly or mass.  Lungs: Clear to auscultation bilaterally. Heart:  S1S2, no rubs, murmurs, gallops. Abdomen:  soft, non-tender, no hepatosplenomegaly, hernia, or mass and BS+.  Rectal: Deferred until colonoscopy Lymph:  no cervical or supraclavicular adenopathy. Extremities:   no edema, cyanosis or clubbing Skin   no rash. Neuro:  A&O x 3.  Psych:  appropriate mood and  Affect.   Data Reviewed: See HPI

## 2019-02-16 NOTE — Patient Instructions (Signed)
As we discussed, here are my recommendations for you:  Go ahead and see the cardiologist.  Please let me know who and when you are seeing them when you find out.  I will communicate with them.  Also just to be sure remember to let me know after you saw the cardiologist.  We will still need to schedule your evaluation but that depends on what the cardiologist recommends as far as testing before we do any type of procedures.  I will have your ultrasound reviewed again to try to find out exactly where the stones are and whether or not you need an MRCP (a special MRI).  You will need a colonoscopy but we need to wait for the cardiology evaluation.  I anticipate you seeing a surgeon as well.   Here is the nutrition information I spoke about.  Remember that in your case were I think you might have symptomatic gallstones, you should not eat high fat foods which some people do i.e. keto diet.  You may not want to make any major changes right now until we sort through but you can learn.  The Mediterranean diet is a great diet to follow.   Healthy and nutritious eating and weight loss are made to be harder than they need to be. A simplified diet approach based around eating normally, as much as you want without restricting intake too much is better, I think. You must avoid packaged foods and try to eat real foods as much as possible. Packaged foods, sugary sodas, highly processed foods taste great but are slow poisons that lead to obesity and/or poor health.  It is very helpful to take some time each week and plan your meals. Work with your spouse, partner, family to do this as much as possible. Preparing meals ahead to take to work or school is especially helpful and will save money, too. You can do this and by working together it can take less time.  Another way to help is to order food on-line for pick-up (or delivery if you can afford) and you will avoid impulse buys of unhealthy foods.  Some resources  that I like are:  www.gaplesinstitute.org (Do the learning modules about healthy eating)  www.dietdoctor.com - helps with low carb diets and also can learn about and consider intermittent  fasting. If you have diabetes would not do intermittent fasting without checking with your doctor. Best to change what you eat before doing this.   I recommend you check your blood sugar daily and keep a log. If you are diabetic and check sugars.  Here are some guidelines to help you with meal planning -  Avoid all processed and packaged foods (bread, pasta, crackers, chips, etc) and beverages containing calories.  Avoid added sugars and excessive natural sugars.  Attention to how you feel if you consume artificial sweeteners.  Do they make you more hungry or raise your blood sugar?  I appreciate the opportunity to care for you. Gatha Mayer, MD, [facility]

## 2019-02-17 LAB — TISSUE TRANSGLUTAMINASE, IGA: (tTG) Ab, IgA: 4 U/mL — ABNORMAL HIGH

## 2019-02-22 ENCOUNTER — Encounter: Payer: Self-pay | Admitting: Internal Medicine

## 2019-03-02 ENCOUNTER — Telehealth: Payer: Self-pay | Admitting: Internal Medicine

## 2019-03-02 DIAGNOSIS — R0789 Other chest pain: Secondary | ICD-10-CM | POA: Insufficient documentation

## 2019-03-02 NOTE — Telephone Encounter (Signed)
I see he has 12/30 cards appt  We should wait til they make recommendations before egd and/or colonoscopy

## 2019-03-02 NOTE — Telephone Encounter (Signed)
Does he need clearance prior to EGD.  Your last office note says possible colonoscopy after cardiology evaluation?

## 2019-03-02 NOTE — Telephone Encounter (Signed)
Lab test mildly + for celiac disease  I recommend he have an EGD to take duodenal bxs  As we discussed though he also needs to see cardiology due to the chest pain as did not think surgery would operate w/o that eval He was going to schedule that per recommendation of his PCP  Has he scheduled that appointment?  FYI also I had radiology check the ultrasound and we do not need an MRI of the gallbladder (think he could have gallbladder removed to take gallstones out)

## 2019-03-02 NOTE — Progress Notes (Signed)
Patient referred by [name], MD for chest pain  Subjective:   Ryan Ray, male    DOB: [birth year], 58 y.o.   MRN: [id number]   Chief Complaint  Patient presents with  . Chest Pain     HPI  58 y.o. Caucasian male with type 2 DM, dyslipidemia, GERD, gall bladder stones, suspected celiac disease, referred for evaluation of chest pain.  Patient works in a [employer]. He is seeing gastroenterology for GERD, gall bladder stones, and Hemoccult-positive stools, as well as suspected celiac disease. In the meantime, he reported a few episodes of chest pain. He reports two separate complaints. One is a "flutter like sensation" in middle of his chest that lasts a few seconds. He also reports episodes of sharp pain on both right and left side of chest, occurring at rest, lasts for a few min and is self limiting. He denies any exertional chest pain.  Patient has uncontrolled diabetes. He reports BP is high at initial check, but always comes down. He is not on any antihypertensive medication. He chewed tobacco when he was in college, but has not smoked or chewed tobacco since then.    Past Medical History:  Diagnosis Date  . BPH (benign prostatic hyperplasia)   . Diabetes mellitus   . Dyslipidemia   . External hemorrhoids without mention of complication   . GERD (gastroesophageal reflux disease)   . Hiatal hernia   . History of kidney stones   . Hyperplastic colon polyp 08/01/2011  . Metabolic syndrome   . PLANTAR FASCIITIS, RIGHT 03/28/2009   Qualifier: Diagnosis of  By: Jerold Coombe    . Renal disorder   . Sleep apnea   . Vomiting    chronic, intermittent and rare    Past Surgical History:  Procedure Laterality Date  . BILATERAL KNEE ARTHROSCOPY    . COLONOSCOPY  01/02/2006   multiple  . ESOPHAGOGASTRODUODENOSCOPY  05/13/2008   multiple   . PILONIDAL CYST / SINUS EXCISION  2011  . UPPER GASTROINTESTINAL ENDOSCOPY       Social History   Tobacco Use    Smoking Status Never Smoker  Smokeless Tobacco Former Systems developer  . Types: Chew    Social History   Substance and Sexual Activity  Alcohol Use No     Family History  Problem Relation Age of Onset  . Crohn's disease Mother   . Diabetes Father   . Stomach cancer Father   . Esophageal cancer Father   . Crohn's disease Brother   . Colon cancer Neg Hx      Current Outpatient Medications on File Prior to Visit  Medication Sig Dispense Refill  . glipiZIDE (GLUCOTROL) 5 MG tablet Take 5 mg by mouth 2 (two) times daily before a meal.    . metformin (FORTAMET) 1000 MG (OSM) 24 hr tablet Take 1,000 mg by mouth 2 (two) times daily with a meal.     . pantoprazole (PROTONIX) 40 MG tablet Take 40 mg by mouth daily.     . rosuvastatin (CRESTOR) 10 MG tablet Take 10 mg by mouth at bedtime.    . Tamsulosin HCl (FLOMAX) 0.4 MG CAPS Take 1 capsule (0.4 mg total) by mouth at bedtime. 14 capsule 0   No current facility-administered medications on file prior to visit.    Cardiovascular and other pertinent studies:   EKG 02/08/2019: Sinus rhythm 79 bpm.  Normal EKG.   Recent labs: 02/18/2019: H/H 17/50. MCV 85. Platelets 236  Review of Systems  Constitution: Negative for decreased appetite, malaise/fatigue, weight gain and weight loss.  HENT: Negative for congestion.   Eyes: Negative for visual disturbance.  Cardiovascular: Positive for chest pain. Negative for dyspnea on exertion, leg swelling, palpitations and syncope.  Respiratory: Negative for cough.   Endocrine: Negative for cold intolerance.  Hematologic/Lymphatic: Does not bruise/bleed easily.  Skin: Negative for itching and rash.  Musculoskeletal: Negative for myalgias.  Gastrointestinal: Positive for abdominal pain and nausea. Negative for vomiting.  Genitourinary: Negative for dysuria.  Neurological: Negative for dizziness and weakness.  Psychiatric/Behavioral: The patient is not nervous/anxious.   All other systems  reviewed and are negative.        Vitals:   03/03/19 0954  BP: (!) 153/87  Pulse: 80  SpO2: 98%     Body mass index is 28.7 kg/m. Filed Weights   03/03/19 0954  Weight: 200 lb (90.7 kg)     Objective:   Physical Exam  Constitutional: He is oriented to person, place, and time. He appears well-developed and well-nourished. No distress.  HENT:  Head: Normocephalic and atraumatic.  Eyes: Pupils are equal, round, and reactive to light. Conjunctivae are normal.  Neck: No JVD present.  Cardiovascular: Normal rate, regular rhythm and intact distal pulses.  No murmur heard. Pulmonary/Chest: Effort normal and breath sounds normal. He has no wheezes. He has no rales.  Abdominal: Soft. Bowel sounds are normal. There is no rebound.  Musculoskeletal:        General: No edema.  Lymphadenopathy:    He has no cervical adenopathy.  Neurological: He is alert and oriented to person, place, and time. No cranial nerve deficit.  Skin: Skin is warm and dry.  Psychiatric: He has a normal mood and affect.  Nursing note and vitals reviewed.       Assessment & Recommendations:   58 y.o. Caucasian male with type 2 DM, dyslipidemia, GERD, gall bladder stones, suspected celiac disease, referred for evaluation of chest pain.  Chest pain: No other significant risk factor other than age, and DM. Chest pain is likely non-anginal. I will check calcium score for risk stratification. If low, do not think he needs further cardiac workup prior to EGD and colonoscopy.   Elevated blood pressure without diagnosis of hypertension: Improved on repeat check. Continue follow up with PCP.      Thank you for referring the patient to [place]. Please feel free to contact with any questions.  Nigel Mormon, MD [facility] Pager: [phone] Office: [phone]

## 2019-03-02 NOTE — Telephone Encounter (Signed)
Patient calling for the results of the labs from 12/15.  Please advise

## 2019-03-03 ENCOUNTER — Ambulatory Visit (INDEPENDENT_AMBULATORY_CARE_PROVIDER_SITE_OTHER): Payer: 59 | Admitting: Cardiology

## 2019-03-03 ENCOUNTER — Other Ambulatory Visit: Payer: Self-pay

## 2019-03-03 ENCOUNTER — Encounter: Payer: Self-pay | Admitting: Cardiology

## 2019-03-03 VITALS — BP 136/86 | HR 74 | Ht 70.0 in | Wt 200.0 lb

## 2019-03-03 DIAGNOSIS — R030 Elevated blood-pressure reading, without diagnosis of hypertension: Secondary | ICD-10-CM | POA: Diagnosis not present

## 2019-03-03 DIAGNOSIS — R0789 Other chest pain: Secondary | ICD-10-CM

## 2019-03-03 NOTE — Patient Instructions (Signed)
Your chest pain is less likely to be related to the heart.  However, given your risk factors of diabetes, I will check calcium score.  If calcium score is low, you may proceed with scheduled GI procedure with very low cardiac risk.

## 2019-03-03 NOTE — Telephone Encounter (Signed)
Patient notified.  He will have cardiology send the note to Dr. Carlean Purl

## 2019-03-04 ENCOUNTER — Telehealth: Payer: Self-pay | Admitting: Cardiology

## 2019-03-04 DIAGNOSIS — R0789 Other chest pain: Secondary | ICD-10-CM

## 2019-03-04 NOTE — Telephone Encounter (Signed)
Please schedule EGD and colonoscopy  Heme + stool  thanks

## 2019-03-04 NOTE — Telephone Encounter (Signed)
Calcium score: 9. LM: 0. LAD: 9. Cx: 0. RCA: 0  Non zero, but low calcium score. Needs risk factor modification in the long run, but suspicion for obstructive CAD is low. May proceed with necessary GI workup with low cardiac risk.  Nigel Mormon, MD [facility] Pager: [phone] Office: [phone]

## 2019-03-04 NOTE — Telephone Encounter (Signed)
Did you mean to include our clinical pool in this note? Is this a result note that I need to call patient about Calcium score? Thank you

## 2019-03-04 NOTE — Telephone Encounter (Signed)
Spoke with Camila Li and appointments have been set up for pre-visit and an ECL. Also sending an urgent referral for [facility] to eval and treat gallstones with Dr Coralie Keens. Notified pre-visit of upcoming appointment.

## 2019-03-09 ENCOUNTER — Other Ambulatory Visit: Payer: Self-pay

## 2019-03-09 ENCOUNTER — Ambulatory Visit (AMBULATORY_SURGERY_CENTER): Payer: 59 | Admitting: *Deleted

## 2019-03-09 VITALS — Temp 97.0°F | Ht 70.0 in | Wt 198.0 lb

## 2019-03-09 DIAGNOSIS — Z1159 Encounter for screening for other viral diseases: Secondary | ICD-10-CM

## 2019-03-09 DIAGNOSIS — R195 Other fecal abnormalities: Secondary | ICD-10-CM

## 2019-03-09 NOTE — Progress Notes (Signed)
Patient is here in-person for PV. Patient denies any allergies to eggs or soy. Patient denies any problems with anesthesia/sedation. Patient denies any oxygen use at home. Patient denies taking any diet/weight loss medications or blood thinners. Patient is not being treated for MRSA or C-diff.   COVID-19 screening test is on 1/7 Thursday at 130pm, the pt is aware. Pt is aware that care partner will wait in the car during procedure; if they feel like they will be too hot or cold to wait in the car; they may wait in the 4 th floor lobby. Patient is aware to bring only one care partner. We want them to wear a mask (we do not have any that we can provide them), practice social distancing, and we will check their temperatures when they get here.  I did remind the patient that their care partner needs to stay in the parking lot the entire time and have a cell phone available, we will call them when the pt is ready for discharge. Patient will wear mask into building.

## 2019-03-11 ENCOUNTER — Telehealth: Payer: Self-pay

## 2019-03-11 ENCOUNTER — Ambulatory Visit (INDEPENDENT_AMBULATORY_CARE_PROVIDER_SITE_OTHER): Payer: 59

## 2019-03-11 ENCOUNTER — Encounter: Payer: Self-pay | Admitting: Internal Medicine

## 2019-03-11 DIAGNOSIS — Z1159 Encounter for screening for other viral diseases: Secondary | ICD-10-CM

## 2019-03-11 NOTE — Telephone Encounter (Signed)
I called [facility] to follow up on his referral to discuss his gallstones. They have made him an appointment for 03/24/2019.

## 2019-03-12 LAB — SARS CORONAVIRUS 2 (TAT 6-24 HRS): SARS Coronavirus 2: NEGATIVE

## 2019-03-15 ENCOUNTER — Ambulatory Visit (AMBULATORY_SURGERY_CENTER): Payer: 59 | Admitting: Internal Medicine

## 2019-03-15 ENCOUNTER — Encounter: Payer: Self-pay | Admitting: Internal Medicine

## 2019-03-15 ENCOUNTER — Other Ambulatory Visit: Payer: Self-pay

## 2019-03-15 VITALS — BP 116/83 | HR 69 | Temp 98.3°F | Resp 18 | Ht 70.0 in | Wt 198.0 lb

## 2019-03-15 DIAGNOSIS — K2970 Gastritis, unspecified, without bleeding: Secondary | ICD-10-CM

## 2019-03-15 DIAGNOSIS — K317 Polyp of stomach and duodenum: Secondary | ICD-10-CM | POA: Diagnosis not present

## 2019-03-15 DIAGNOSIS — K3189 Other diseases of stomach and duodenum: Secondary | ICD-10-CM

## 2019-03-15 DIAGNOSIS — K621 Rectal polyp: Secondary | ICD-10-CM

## 2019-03-15 DIAGNOSIS — R195 Other fecal abnormalities: Secondary | ICD-10-CM | POA: Diagnosis present

## 2019-03-15 DIAGNOSIS — D128 Benign neoplasm of rectum: Secondary | ICD-10-CM

## 2019-03-15 DIAGNOSIS — R1013 Epigastric pain: Secondary | ICD-10-CM

## 2019-03-15 DIAGNOSIS — D129 Benign neoplasm of anus and anal canal: Secondary | ICD-10-CM

## 2019-03-15 MED ORDER — SODIUM CHLORIDE 0.9 % IV SOLN: 500.0000 mL | Freq: Once | INTRAVENOUS | Status: DC

## 2019-03-15 NOTE — Op Note (Signed)
[place] Patient Name: Ryan Ray Procedure Date: 03/15/2019 9:37 AM MRN: [id number] Endoscopist: Gatha Mayer , MD Age: 59 Referring MD:  Date of Birth: [birth year] Gender: Male Account #: [id number] Procedure:                Colonoscopy Indications:              Positive fecal immunochemical test Medicines:                Propofol per Anesthesia, Monitored Anesthesia Care Procedure:                Pre-Anesthesia Assessment:                           - Prior to the procedure, a History and Physical                            was performed, and patient medications and                            allergies were reviewed. The patient's tolerance of                            previous anesthesia was also reviewed. The risks                            and benefits of the procedure and the sedation                            options and risks were discussed with the patient.                            All questions were answered, and informed consent                            was obtained. Prior Anticoagulants: The patient has                            taken no previous anticoagulant or antiplatelet                            agents. ASA Grade Assessment: II - A patient with                            mild systemic disease. After reviewing the risks                            and benefits, the patient was deemed in                            satisfactory condition to undergo the procedure.                           After obtaining informed consent, the colonoscope  was passed under direct vision. Throughout the                            procedure, the patient's blood pressure, pulse, and                            oxygen saturations were monitored continuously. The                            Colonoscope was introduced through the anus and                            advanced to the the terminal ileum, with                            identification  of the appendiceal orifice and IC                            valve. The colonoscopy was performed without                            difficulty. The patient tolerated the procedure                            well. The quality of the bowel preparation was                            good. The ileocecal valve, appendiceal orifice, and                            rectum were photographed. The bowel preparation                            used was Miralax via split dose instruction. Scope In: 9:59:03 AM Scope Out: 10:09:38 AM Scope Withdrawal Time: 0 hours 8 minutes 29 seconds  Total Procedure Duration: 0 hours 10 minutes 35 seconds  Findings:                 The perianal and digital rectal examinations were                            normal. Pertinent negatives include normal prostate                            (size, shape, and consistency).                           The terminal ileum appeared normal.                           A 1 to 2 mm polyp was found in the rectum. The                            polyp was sessile. The polyp was removed with a  cold biopsy forceps. Resection and retrieval were                            complete. Verification of patient identification                            for the specimen was done. Estimated blood loss was                            minimal.                           Internal hemorrhoids were found.                           The exam was otherwise without abnormality on                            direct and retroflexion views. Complications:            No immediate complications. Estimated Blood Loss:     Estimated blood loss was minimal. Impression:               - The examined portion of the ileum was normal.                           - One 1 to 2 mm polyp in the rectum, removed with a                            cold biopsy forceps. Resected and retrieved.                           - Internal hemorrhoids. Think these were  source of                            FIT + stool                           - The examination was otherwise normal on direct                            and retroflexion views. Recommendation:           - Patient has a contact number available for                            emergencies. The signs and symptoms of potential                            delayed complications were discussed with the                            patient. Return to normal activities tomorrow.                            Written discharge instructions were provided  to the                            patient.                           - Resume previous diet.                           - Continue present medications.                           - Repeat colonoscopy is recommended. The                            colonoscopy date will be determined after pathology                            results from today's exam become available for                            review. HE LIKELY DOES NOT NEED ANY COLON CANCER                            SECREENING INCLUDING STOOL TESTS FOR 10 YEARS Gatha Mayer, MD 03/15/2019 10:32:28 AM This report has been signed electronically.

## 2019-03-15 NOTE — Patient Instructions (Addendum)
I found the following:   1) Benign-appearing stomach polyps - biopsied 2) mild stomach inflammation = gastritis- biopsied 3) one tine rectal polyp - removed  I also too biopsies to check for celiac disease (gluten allergy).  In sum - nothing bad going on.  I will let you know results and plans soon and wait to see what Dr. Ninfa Linden says regarding gasllstones.  I appreciate the opportunity to care for you. Gatha Mayer, MD, [facility]   Handouts on hemorrhoids, polyps, and gastritis given to you to day.    YOU HAD AN ENDOSCOPIC PROCEDURE TODAY AT [facility]:   Refer to the procedure report that was given to you for any specific questions about what was found during the examination.  If the procedure report does not answer your questions, please call your gastroenterologist to clarify.  If you requested that your care partner not be given the details of your procedure findings, then the procedure report has been included in a sealed envelope for you to review at your convenience later.  YOU SHOULD EXPECT: Some feelings of bloating in the abdomen. Passage of more gas than usual.  Walking can help get rid of the air that was put into your GI tract during the procedure and reduce the bloating. If you had a lower endoscopy (such as a colonoscopy or flexible sigmoidoscopy) you may notice spotting of blood in your stool or on the toilet paper. If you underwent a bowel prep for your procedure, you may not have a normal bowel movement for a few days.  Please Note:  You might notice some irritation and congestion in your nose or some drainage.  This is from the oxygen used during your procedure.  There is no need for concern and it should clear up in a day or so.  SYMPTOMS TO REPORT IMMEDIATELY:   Following lower endoscopy (colonoscopy or flexible sigmoidoscopy):  Excessive amounts of blood in the stool  Significant tenderness or worsening of abdominal pains  Swelling of the  abdomen that is new, acute  Fever of 100F or higher   Following upper endoscopy (EGD)  Vomiting of blood or coffee ground material  New chest pain or pain under the shoulder blades  Painful or persistently difficult swallowing  New shortness of breath  Fever of 100F or higher  Black, tarry-looking stools  For urgent or emergent issues, a gastroenterologist can be reached at any hour by calling [phone].   DIET:  We do recommend a small meal at first, but then you may proceed to your regular diet.  Drink plenty of fluids but you should avoid alcoholic beverages for 24 hours.  ACTIVITY:  You should plan to take it easy for the rest of today and you should NOT DRIVE or use heavy machinery until tomorrow (because of the sedation medicines used during the test).    FOLLOW UP: Our staff will call the number listed on your records 48-72 hours following your procedure to check on you and address any questions or concerns that you may have regarding the information given to you following your procedure. If we do not reach you, we will leave a message.  We will attempt to reach you two times.  During this call, we will ask if you have developed any symptoms of COVID 19. If you develop any symptoms (ie: fever, flu-like symptoms, shortness of breath, cough etc.) before then, please call [phone].  If you test positive for Covid 19 in  the 2 weeks post procedure, please call and report this information to [place].    If any biopsies were taken you will be contacted by phone or by letter within the next 1-3 weeks.  Please call us at [phone] if you have not heard about the biopsies in 3 weeks.    SIGNATURES/CONFIDENTIALITY: You and/or your care partner have signed paperwork which will be entered into your electronic medical record.  These signatures attest to the fact that that the information above on your After Visit Summary has been reviewed and is understood.  Full responsibility of the  confidentiality of this discharge information lies with you and/or your care-partner.

## 2019-03-15 NOTE — Progress Notes (Signed)
Temp JR  VS DT  Pt's states no medical or surgical changes since previsit or office visit. 

## 2019-03-15 NOTE — Progress Notes (Signed)
Called to room to assist during endoscopic procedure.  Patient ID and intended procedure confirmed with present staff. Received instructions for my participation in the procedure from the performing physician.  

## 2019-03-15 NOTE — Progress Notes (Signed)
PT taken to PACU. Monitors in place. VSS. Report given to RN. 

## 2019-03-15 NOTE — Op Note (Signed)
[place] Patient Name: Ryan Ray Procedure Date: 03/15/2019 9:37 AM MRN: [id number] Endoscopist: Gatha Mayer , MD Age: 59 Referring MD:  Date of Birth: 1960-10-14 Gender: Male Account #: [id number] Procedure:                Upper GI endoscopy Indications:              Epigastric abdominal pain Medicines:                Propofol per Anesthesia, Monitored Anesthesia Care Procedure:                Pre-Anesthesia Assessment:                           - Prior to the procedure, a History and Physical                            was performed, and patient medications and                            allergies were reviewed. The patient's tolerance of                            previous anesthesia was also reviewed. The risks                            and benefits of the procedure and the sedation                            options and risks were discussed with the patient.                            All questions were answered, and informed consent                            was obtained. Prior Anticoagulants: The patient has                            taken no previous anticoagulant or antiplatelet                            agents. ASA Grade Assessment: II - A patient with                            mild systemic disease. After reviewing the risks                            and benefits, the patient was deemed in                            satisfactory condition to undergo the procedure.                           After obtaining informed consent, the endoscope was  passed under direct vision. Throughout the                            procedure, the patient's blood pressure, pulse, and                            oxygen saturations were monitored continuously. The                            Endoscope was introduced through the mouth, and                            advanced to the second part of duodenum. The upper                            GI  endoscopy was accomplished without difficulty.                            The patient tolerated the procedure well. Scope In: Scope Out: Findings:                 The examined esophagus was normal.                           Multiple diminutive semi-sessile polyps with no                            stigmata of recent bleeding were found in the                            gastric body. Biopsies were taken with a cold                            forceps for histology. Verification of patient                            identification for the specimen was done. Estimated                            blood loss was minimal.                           Patchy mild inflammation characterized by erythema,                            friability and granularity was found in the gastric                            antrum. Biopsies were taken with a cold forceps for                            histology. Verification of patient identification                            for the  specimen was done. Estimated blood loss was                            minimal.                           The examined duodenum was normal. Biopsies for                            histology were taken with a cold forceps for                            evaluation of celiac disease. Verification of                            patient identification for the specimen was done.                            Estimated blood loss was minimal.                           The cardia and gastric fundus were normal on                            retroflexion.                           The exam was otherwise without abnormality. Complications:            No immediate complications. Estimated Blood Loss:     Estimated blood loss was minimal. Impression:               - Normal esophagus.                           - Multiple gastric polyps. Biopsied. look like                            benign fundic gland polyps                           - Gastritis. Mild  Biopsied.                           - Normal examined duodenum. Biopsied. Has TTG Ab of                            4 one point above NL                           - The examination was otherwise normal. Recommendation:           - Patient has a contact number available for                            emergencies. The signs and symptoms of potential  delayed complications were discussed with the                            patient. Return to normal activities tomorrow.                            Written discharge instructions were provided to the                            patient.                           - Resume previous diet.                           - Continue present medications.                           - Await pathology results.                           - See the other procedure note for documentation of                            additional recommendations. Gatha Mayer, MD 03/15/2019 10:28:44 AM This report has been signed electronically.

## 2019-03-17 ENCOUNTER — Telehealth: Payer: Self-pay | Admitting: *Deleted

## 2019-03-17 NOTE — Telephone Encounter (Signed)
  Follow up Call-  Call back number 03/15/2019  Post procedure Call Back phone  # [phone]  Permission to leave phone message Yes  Some recent data might be hidden     Patient questions:  Do you have a fever, pain , or abdominal swelling? No. Pain Score  0 *  Have you tolerated food without any problems? Yes.    Have you been able to return to your normal activities? Yes.    Do you have any questions about your discharge instructions: Diet   No. Medications  No. Follow up visit  No.  Do you have questions or concerns about your Care? No.  Actions: * If pain score is 4 or above: 1. No action needed, pain <4.Have you developed a fever since your procedure? no  2.   Have you had an respiratory symptoms (SOB or cough) since your procedure? no  3.   Have you tested positive for COVID 19 since your procedure no  4.   Have you had any family members/close contacts diagnosed with the COVID 19 since your procedure?  no   If yes to any of these questions please route to Joylene John, RN and Alphonsa Gin, Therapist, sports.

## 2019-03-24 ENCOUNTER — Other Ambulatory Visit: Payer: Self-pay | Admitting: Surgery

## 2019-03-25 ENCOUNTER — Encounter: Payer: Self-pay | Admitting: *Deleted

## 2019-04-06 ENCOUNTER — Other Ambulatory Visit: Payer: Self-pay

## 2019-04-06 ENCOUNTER — Encounter (HOSPITAL_BASED_OUTPATIENT_CLINIC_OR_DEPARTMENT_OTHER): Payer: Self-pay | Admitting: Surgery

## 2019-04-06 NOTE — Progress Notes (Addendum)
Spoke w/ via phone for pre-op interview---[facility] needs dos----    I stat 8 and ekg          Lab results------lov dr Geni Bers cardiology 03-03-2019 chart/epic COVID test ------04-08-2019 Arrive at -------800 am 04-12-2019 NPO after ------midnight Medications to take morning of surgery -----pantaprazole Diabetic medication -----none day of surgery Patient Special Instructions ----- Pre-Op special Istructions ----- Patient verbalized understanding of instructions that were given at this phone interview. Patient denies shortness of breath, chest pain, fever, cough a this phone  interview.  Called dr Geni Bers office and dr [name] office no ekg available per both offices, dr [name] office stated last ekg 2012 and no ekg at dr Geni Bers office.

## 2019-04-08 ENCOUNTER — Other Ambulatory Visit (HOSPITAL_COMMUNITY)
Admission: RE | Admit: 2019-04-08 | Discharge: 2019-04-08 | Disposition: A | Discharge status: Home / Self Care | Payer: 59 | Source: Ambulatory Visit | Attending: Surgery | Admitting: Surgery

## 2019-04-08 DIAGNOSIS — Z01812 Encounter for preprocedural laboratory examination: Secondary | ICD-10-CM | POA: Diagnosis not present

## 2019-04-08 DIAGNOSIS — Z20822 Contact with and (suspected) exposure to covid-19: Secondary | ICD-10-CM | POA: Insufficient documentation

## 2019-04-08 LAB — SARS CORONAVIRUS 2 (TAT 6-24 HRS): SARS Coronavirus 2: NEGATIVE

## 2019-04-09 ENCOUNTER — Encounter: Payer: 59 | Admitting: Internal Medicine

## 2019-04-11 NOTE — H&P (Signed)
Chalres Ray Documented: 03/24/2019 9:19 AM Location: [facility] Patient #: [id number] DOB: 05/14/1960 Married / Language: Ryan Ray / Race: White Male   History of Present Illness Nathaneil Ray A. Ryan Linden MD; 03/24/2019 9:43 AM) The patient is a 59 year old male who presents for evaluation of gall stones. This is a pleasant 59 year old gentleman referred by Dr. Carlean Purl for evaluation of symptomatic gallstones. For years, he has been having right upper quadrant abdominal pain with occasional nausea and vomiting. The attacks are getting worse. He has previously had negative ultrasounds that he most recently had one showing cholelithiasis. He recently had an upper and lower endoscopy some benign polyps and no other abnormalities. He has a history of kidney stones. He has a recent CAT scan that was otherwise unremarkable as well. He reports moderate to severe right upper quadrant abdominal pain after fatty meals with occasional nausea and vomiting. The pain is sharp and does not refer anywhere else. He is pain-free today. He is otherwise without complaints.   Past Surgical History Ryan Ray, [place]; 03/24/2019 9:19 AM) Knee Surgery  Bilateral.  Diagnostic Studies History Ryan Ray, CMA; 03/24/2019 9:19 AM) Colonoscopy  within last year  Allergies Ryan Ray, [place]; 03/24/2019 9:21 AM) Oxycodone w/Acetaminophen *ANALGESICS - OPIOID*  Tylox *ANALGESICS - OPIOID*   Medication History (Ryan Ray, CMA; 03/24/2019 9:23 AM) Ryan Ray (5MG  Tablet, Oral) Active. Ryan Ray (1000MG  Tablet ER 24HR, Oral) Active. Ryan Ray (40MG  Tablet DR, Oral) Active. Ryan Ray (10MG  Tablet, Oral) Active. Ryan Ray (0.4MG  Capsule ER 24HR, Oral) Active. Medications Reconciled  Social History Ryan Ray, CMA; 03/24/2019 9:19 AM) Caffeine use  Coffee. No drug use  Tobacco use  Never smoker.  Family History Ryan Ray, [place]; 03/24/2019 9:19 AM) Alcohol Abuse   Father. Arthritis  Mother. Heart Disease  Mother. Hypertension  Mother. Ischemic Bowel Disease  Brother, Mother.  Other Problems Ryan Ray, CMA; 03/24/2019 9:19 AM) Anxiety Disorder  Cholelithiasis  Diabetes Mellitus  Gastroesophageal Reflux Disease  Hemorrhoids  Kidney Stone  Sleep Apnea     Review of Systems (Ryan Ray CMA; 03/24/2019 9:19 AM) General Not Present- Appetite Loss, Chills, Fatigue, Fever, Night Sweats, Weight Gain and Weight Loss. Skin Present- Dryness. Not Present- Change in Wart/Mole, Hives, Jaundice, New Lesions, Non-Healing Wounds, Rash and Ulcer. HEENT Not Present- Earache, Hearing Loss, Hoarseness, Nose Bleed, Oral Ulcers, Ringing in the Ears, Seasonal Allergies, Sinus Pain, Sore Throat, Visual Disturbances, Wears glasses/contact lenses and Yellow Eyes. Respiratory Present- Snoring. Not Present- Bloody sputum, Chronic Cough, Difficulty Breathing and Wheezing. Breast Not Present- Breast Mass, Breast Pain, Nipple Discharge and Skin Changes. Cardiovascular Not Present- Chest Pain, Difficulty Breathing Lying Down, Leg Cramps, Palpitations, Rapid Heart Rate, Shortness of Breath and Swelling of Extremities. Gastrointestinal Present- Hemorrhoids. Not Present- Abdominal Pain, Bloating, Bloody Stool, Change in Bowel Habits, Chronic diarrhea, Constipation, Difficulty Swallowing, Excessive gas, Gets full quickly at meals, Indigestion, Nausea, Rectal Pain and Vomiting. Male Genitourinary Present- Impotence. Not Present- Blood in Urine, Change in Urinary Stream, Frequency, Nocturia, Painful Urination, Urgency and Urine Leakage. Musculoskeletal Not Present- Back Pain, Joint Pain, Joint Stiffness, Muscle Pain, Muscle Weakness and Swelling of Extremities. Neurological Not Present- Decreased Memory, Fainting, Headaches, Numbness, Seizures, Tingling, Tremor, Trouble walking and Weakness. Psychiatric Not Present- Anxiety, Bipolar, Change in Sleep Pattern,  Depression, Fearful and Frequent crying. Endocrine Not Present- Cold Intolerance, Excessive Hunger, Hair Changes, Heat Intolerance, Hot flashes and New Diabetes. Hematology Not Present- Blood Thinners, Easy Bruising, Excessive bleeding, Gland problems, HIV and Persistent Infections.  Vitals (Ryan Glo Herring CMA;  03/24/2019 9:20 AM) 03/24/2019 9:20 AM Weight: 199.5 lb Height: 70in Body Surface Area: 2.09 m Body Mass Index: 28.62 kg/m  Temp.: 98.57F  Pulse: 107 (Regular)  P.OX: 98% (Room air) BP: 144/86 (Sitting, Left Arm, Standard)       Physical Exam (Bennye Nix A. Ryan Linden MD; 03/24/2019 9:44 AM) The physical exam findings are as follows: Note:On exam, he appears well. His abdomen is soft with some mild right upper quadrant tenderness. There is rectus diastases.  I reviewed his ultrasound, CT scan, endoscopy results, and notes from Dr. Carlean Purl.    Assessment & Plan (Ryan Ray A. Ryan Linden MD; 03/24/2019 9:45 AM) SYMPTOMATIC CHOLELITHIASIS (K80.20) Impression: This patient does have symptomatic cholelithiasis. I discussed this with him in detail and a laparoscopic cholecystectomy is recommended. I discussed the procedure in detail. The patient was given Neurosurgeon. We discussed the risks and benefits of a laparoscopic cholecystectomy and possible cholangiogram including, but not limited to, bleeding, infection, injury to surrounding structures such as the intestine or liver, bile leak, retained gallstones, need to convert to an open procedure, prolonged diarrhea, blood clots such as DVT, common bile duct injury, anesthesia risks, and possible need for additional procedures. The likelihood of improvement in symptoms and return to the patient's normal status is good. We discussed the typical post-operative recovery course. All questions were answered.  This patient encounter took 30 minutes today to perform the following: take history, perform exam, review outside records,  interpret imaging, counsel the patient on their diagnosis and document encounter, findings & plan in the EHR

## 2019-04-12 ENCOUNTER — Ambulatory Visit (HOSPITAL_BASED_OUTPATIENT_CLINIC_OR_DEPARTMENT_OTHER): Payer: 59 | Admitting: Anesthesiology

## 2019-04-12 ENCOUNTER — Encounter (HOSPITAL_BASED_OUTPATIENT_CLINIC_OR_DEPARTMENT_OTHER): Payer: Self-pay | Admitting: Surgery

## 2019-04-12 ENCOUNTER — Ambulatory Visit (HOSPITAL_BASED_OUTPATIENT_CLINIC_OR_DEPARTMENT_OTHER)
Admission: RE | Admit: 2019-04-12 | Discharge: 2019-04-12 | Disposition: A | Discharge status: Home / Self Care | Payer: 59 | Attending: Surgery | Admitting: Surgery

## 2019-04-12 ENCOUNTER — Encounter (HOSPITAL_BASED_OUTPATIENT_CLINIC_OR_DEPARTMENT_OTHER)
Admission: RE | Disposition: A | Discharge status: Home / Self Care | Payer: Self-pay | Source: Home / Self Care | Attending: Surgery

## 2019-04-12 DIAGNOSIS — Z811 Family history of alcohol abuse and dependence: Secondary | ICD-10-CM | POA: Diagnosis not present

## 2019-04-12 DIAGNOSIS — Z7984 Long term (current) use of oral hypoglycemic drugs: Secondary | ICD-10-CM | POA: Diagnosis not present

## 2019-04-12 DIAGNOSIS — Z8249 Family history of ischemic heart disease and other diseases of the circulatory system: Secondary | ICD-10-CM | POA: Insufficient documentation

## 2019-04-12 DIAGNOSIS — K8010 Calculus of gallbladder with chronic cholecystitis without obstruction: Secondary | ICD-10-CM | POA: Insufficient documentation

## 2019-04-12 DIAGNOSIS — F419 Anxiety disorder, unspecified: Secondary | ICD-10-CM | POA: Insufficient documentation

## 2019-04-12 DIAGNOSIS — Z8261 Family history of arthritis: Secondary | ICD-10-CM | POA: Diagnosis not present

## 2019-04-12 DIAGNOSIS — G4730 Sleep apnea, unspecified: Secondary | ICD-10-CM | POA: Insufficient documentation

## 2019-04-12 DIAGNOSIS — Z87442 Personal history of urinary calculi: Secondary | ICD-10-CM | POA: Insufficient documentation

## 2019-04-12 DIAGNOSIS — E119 Type 2 diabetes mellitus without complications: Secondary | ICD-10-CM | POA: Diagnosis not present

## 2019-04-12 DIAGNOSIS — Z8379 Family history of other diseases of the digestive system: Secondary | ICD-10-CM | POA: Insufficient documentation

## 2019-04-12 DIAGNOSIS — K219 Gastro-esophageal reflux disease without esophagitis: Secondary | ICD-10-CM | POA: Diagnosis not present

## 2019-04-12 DIAGNOSIS — Z79899 Other long term (current) drug therapy: Secondary | ICD-10-CM | POA: Insufficient documentation

## 2019-04-12 DIAGNOSIS — Z885 Allergy status to narcotic agent status: Secondary | ICD-10-CM | POA: Diagnosis not present

## 2019-04-12 DIAGNOSIS — Z888 Allergy status to other drugs, medicaments and biological substances status: Secondary | ICD-10-CM | POA: Diagnosis not present

## 2019-04-12 DIAGNOSIS — K828 Other specified diseases of gallbladder: Secondary | ICD-10-CM | POA: Insufficient documentation

## 2019-04-12 HISTORY — DX: Calculus of gallbladder without cholecystitis without obstruction: K80.20

## 2019-04-12 HISTORY — PX: CHOLECYSTECTOMY: SHX55

## 2019-04-12 LAB — GLUCOSE, CAPILLARY: Glucose-Capillary: 267 mg/dL — ABNORMAL HIGH (ref 70–99)

## 2019-04-12 LAB — POCT I-STAT, CHEM 8
BUN: 12 mg/dL (ref 6–20)
Calcium, Ion: 1.18 mmol/L (ref 1.15–1.40)
Chloride: 103 mmol/L (ref 98–111)
Creatinine, Ser: 0.8 mg/dL (ref 0.61–1.24)
Glucose, Bld: 206 mg/dL — ABNORMAL HIGH (ref 70–99)
HCT: 50 % (ref 39.0–52.0)
Hemoglobin: 17 g/dL (ref 13.0–17.0)
Potassium: 4.7 mmol/L (ref 3.5–5.1)
Sodium: 138 mmol/L (ref 135–145)
TCO2: 28 mmol/L (ref 22–32)

## 2019-04-12 SURGERY — LAPAROSCOPIC CHOLECYSTECTOMY
Anesthesia: General | Site: Abdomen

## 2019-04-12 MED ORDER — LIDOCAINE 2% (20 MG/ML) 5 ML SYRINGE
INTRAMUSCULAR | Status: AC
  Filled 2019-04-12: qty 5

## 2019-04-12 MED ORDER — SCOPOLAMINE 1 MG/3DAYS TD PT72
MEDICATED_PATCH | TRANSDERMAL | Status: AC
  Filled 2019-04-12: qty 1

## 2019-04-12 MED ORDER — GABAPENTIN 300 MG PO CAPS
ORAL_CAPSULE | ORAL | Status: AC
  Filled 2019-04-12: qty 1

## 2019-04-12 MED ORDER — CHLORHEXIDINE GLUCONATE CLOTH 2 % EX PADS
6.0000 | MEDICATED_PAD | Freq: Once | CUTANEOUS | Status: DC
  Filled 2019-04-12: qty 6

## 2019-04-12 MED ORDER — ROCURONIUM BROMIDE 10 MG/ML (PF) SYRINGE
PREFILLED_SYRINGE | INTRAVENOUS | Status: AC
  Filled 2019-04-12: qty 10

## 2019-04-12 MED ORDER — SODIUM CHLORIDE 0.9 % IR SOLN
Status: DC | PRN
Start: 2019-04-12 — End: 2019-04-12
  Administered 2019-04-12: 3000 mL

## 2019-04-12 MED ORDER — MIDAZOLAM HCL 2 MG/2ML IJ SOLN
INTRAMUSCULAR | Status: AC
  Filled 2019-04-12: qty 2

## 2019-04-12 MED ORDER — ONDANSETRON HCL 4 MG/2ML IJ SOLN
INTRAMUSCULAR | Status: AC
  Filled 2019-04-12: qty 2

## 2019-04-12 MED ORDER — PROPOFOL 10 MG/ML IV BOLUS
INTRAVENOUS | Status: DC | PRN
  Administered 2019-04-12: 150 mg via INTRAVENOUS

## 2019-04-12 MED ORDER — ONDANSETRON HCL 4 MG/2ML IJ SOLN
INTRAMUSCULAR | Status: DC | PRN
  Administered 2019-04-12: 4 mg via INTRAVENOUS

## 2019-04-12 MED ORDER — HYDROMORPHONE HCL 1 MG/ML IJ SOLN
0.2500 mg | INTRAMUSCULAR | Status: DC | PRN
  Filled 2019-04-12: qty 0.5

## 2019-04-12 MED ORDER — BUPIVACAINE HCL (PF) 0.5 % IJ SOLN
INTRAMUSCULAR | Status: DC | PRN
  Administered 2019-04-12: 20 mL

## 2019-04-12 MED ORDER — FENTANYL CITRATE (PF) 250 MCG/5ML IJ SOLN
INTRAMUSCULAR | Status: AC
  Filled 2019-04-12: qty 5

## 2019-04-12 MED ORDER — ACETAMINOPHEN 500 MG PO TABS
ORAL_TABLET | ORAL | Status: AC
  Filled 2019-04-12: qty 2

## 2019-04-12 MED ORDER — SCOPOLAMINE 1 MG/3DAYS TD PT72
MEDICATED_PATCH | TRANSDERMAL | Status: DC | PRN
  Administered 2019-04-12: 1 via TRANSDERMAL

## 2019-04-12 MED ORDER — CEFAZOLIN SODIUM-DEXTROSE 2-4 GM/100ML-% IV SOLN
2.0000 g | INTRAVENOUS | Status: AC
  Administered 2019-04-12: 10:00:00 2 g via INTRAVENOUS
  Filled 2019-04-12: qty 100

## 2019-04-12 MED ORDER — CELECOXIB 200 MG PO CAPS
200.0000 mg | ORAL_CAPSULE | ORAL | Status: AC
Start: 2019-04-12 — End: 2019-04-12
  Administered 2019-04-12: 09:00:00 200 mg via ORAL
  Filled 2019-04-12: qty 1

## 2019-04-12 MED ORDER — PROMETHAZINE HCL 25 MG/ML IJ SOLN
6.2500 mg | INTRAMUSCULAR | Status: DC | PRN
  Filled 2019-04-12: qty 1

## 2019-04-12 MED ORDER — TRAMADOL HCL 50 MG PO TABS: 50.0000 mg | ORAL_TABLET | Freq: Four times a day (QID) | ORAL | 0 refills | Status: AC | PRN

## 2019-04-12 MED ORDER — LACTATED RINGERS IV SOLN
INTRAVENOUS | Status: DC
  Filled 2019-04-12: qty 1000

## 2019-04-12 MED ORDER — LIDOCAINE 2% (20 MG/ML) 5 ML SYRINGE
INTRAMUSCULAR | Status: DC | PRN
  Administered 2019-04-12: 80 mg via INTRAVENOUS

## 2019-04-12 MED ORDER — MIDAZOLAM HCL 2 MG/2ML IJ SOLN
INTRAMUSCULAR | Status: DC | PRN
  Administered 2019-04-12 (×2): 2 mg via INTRAVENOUS

## 2019-04-12 MED ORDER — DEXAMETHASONE SODIUM PHOSPHATE 10 MG/ML IJ SOLN
INTRAMUSCULAR | Status: DC | PRN
  Administered 2019-04-12: 5 mg via INTRAVENOUS

## 2019-04-12 MED ORDER — CELECOXIB 200 MG PO CAPS
ORAL_CAPSULE | ORAL | Status: AC
  Filled 2019-04-12: qty 1

## 2019-04-12 MED ORDER — MEPERIDINE HCL 25 MG/ML IJ SOLN
6.2500 mg | INTRAMUSCULAR | Status: DC | PRN
  Filled 2019-04-12: qty 1

## 2019-04-12 MED ORDER — SUGAMMADEX SODIUM 200 MG/2ML IV SOLN
INTRAVENOUS | Status: DC | PRN
  Administered 2019-04-12: 352.8 mg via INTRAVENOUS

## 2019-04-12 MED ORDER — DEXAMETHASONE SODIUM PHOSPHATE 10 MG/ML IJ SOLN
INTRAMUSCULAR | Status: AC
  Filled 2019-04-12: qty 1

## 2019-04-12 MED ORDER — CEFAZOLIN SODIUM-DEXTROSE 2-4 GM/100ML-% IV SOLN
INTRAVENOUS | Status: AC
  Filled 2019-04-12: qty 100

## 2019-04-12 MED ORDER — ROCURONIUM BROMIDE 10 MG/ML (PF) SYRINGE
PREFILLED_SYRINGE | INTRAVENOUS | Status: DC | PRN
  Administered 2019-04-12: 90 mg via INTRAVENOUS

## 2019-04-12 MED ORDER — GABAPENTIN 300 MG PO CAPS
300.0000 mg | ORAL_CAPSULE | ORAL | Status: AC
  Administered 2019-04-12: 09:00:00 300 mg via ORAL
  Filled 2019-04-12: qty 1

## 2019-04-12 MED ORDER — SUGAMMADEX SODIUM 200 MG/2ML IV SOLN: INTRAVENOUS | Status: DC | PRN

## 2019-04-12 MED ORDER — KETOROLAC TROMETHAMINE 30 MG/ML IJ SOLN
INTRAMUSCULAR | Status: AC
  Filled 2019-04-12: qty 1

## 2019-04-12 MED ORDER — ACETAMINOPHEN 500 MG PO TABS
1000.0000 mg | ORAL_TABLET | ORAL | Status: AC
  Administered 2019-04-12: 1000 mg via ORAL
  Filled 2019-04-12: qty 2

## 2019-04-12 MED ORDER — PROPOFOL 10 MG/ML IV BOLUS
INTRAVENOUS | Status: AC
  Filled 2019-04-12: qty 20

## 2019-04-12 MED ORDER — FENTANYL CITRATE (PF) 100 MCG/2ML IJ SOLN
INTRAMUSCULAR | Status: DC | PRN
  Administered 2019-04-12 (×2): 50 ug via INTRAVENOUS
  Administered 2019-04-12: 100 ug via INTRAVENOUS
  Administered 2019-04-12: 50 ug via INTRAVENOUS

## 2019-04-12 SURGICAL SUPPLY — 64 items
ADH SKN CLS APL DERMABOND .7 (GAUZE/BANDAGES/DRESSINGS) ×1
APL PRP STRL LF DISP 70% ISPRP (MISCELLANEOUS) ×1
APL SKNCLS STERI-STRIP NONHPOA (GAUZE/BANDAGES/DRESSINGS) ×1
APL SWBSTK 6 STRL LF DISP (MISCELLANEOUS) ×1
APPLICATOR COTTON TIP 6 STRL (MISCELLANEOUS) ×1 IMPLANT
APPLICATOR COTTON TIP 6IN STRL (MISCELLANEOUS) ×3
APPLIER CLIP 5 13 M/L LIGAMAX5 (MISCELLANEOUS) ×3
APR CLP MED LRG 5 ANG JAW (MISCELLANEOUS) ×1
BAG RETRIEVAL 10 (BASKET)
BAG RETRIEVAL 10MM (BASKET)
BAG SPEC RTRVL LRG 6X4 10 (ENDOMECHANICALS) ×1
BENZOIN TINCTURE PRP APPL 2/3 (GAUZE/BANDAGES/DRESSINGS) ×3 IMPLANT
BLADE CLIPPER SENSICLIP SURGIC (BLADE) ×2 IMPLANT
BNDG ADH 1X3 SHEER STRL LF (GAUZE/BANDAGES/DRESSINGS) ×4 IMPLANT
BNDG ADH THN 3X1 STRL LF (GAUZE/BANDAGES/DRESSINGS)
CABLE HIGH FREQUENCY MONO STRZ (ELECTRODE) ×2 IMPLANT
CANISTER SUCT 3000ML PPV (MISCELLANEOUS) IMPLANT
CANISTER SUCTION 1200CC (MISCELLANEOUS) IMPLANT
CHLORAPREP W/TINT 26 (MISCELLANEOUS) ×3 IMPLANT
CLIP APPLIE 5 13 M/L LIGAMAX5 (MISCELLANEOUS) ×1 IMPLANT
CLOSURE WOUND 1/2 X4 (GAUZE/BANDAGES/DRESSINGS) ×1
COVER WAND RF STERILE (DRAPES) ×3 IMPLANT
DERMABOND ADVANCED (GAUZE/BANDAGES/DRESSINGS) ×2
DERMABOND ADVANCED .7 DNX12 (GAUZE/BANDAGES/DRESSINGS) IMPLANT
DISSECTOR BLUNT TIP ENDO 5MM (MISCELLANEOUS) IMPLANT
DRAPE C-ARM 42X72 X-RAY (DRAPES) ×3 IMPLANT
DRAPE LAPAROSCOPIC ABDOMINAL (DRAPES) ×3 IMPLANT
ELECT REM PT RETURN 9FT ADLT (ELECTROSURGICAL) ×3
ELECTRODE REM PT RTRN 9FT ADLT (ELECTROSURGICAL) ×1 IMPLANT
GLOVE BIOGEL PI IND STRL 6.5 (GLOVE) IMPLANT
GLOVE BIOGEL PI IND STRL 7.0 (GLOVE) IMPLANT
GLOVE BIOGEL PI IND STRL 7.5 (GLOVE) IMPLANT
GLOVE BIOGEL PI INDICATOR 6.5 (GLOVE)
GLOVE BIOGEL PI INDICATOR 7.0 (GLOVE)
GLOVE BIOGEL PI INDICATOR 7.5 (GLOVE)
GLOVE SURG SIGNA 7.5 PF LTX (GLOVE) ×3 IMPLANT
GOWN STRL REUS W/ TWL LRG LVL3 (GOWN DISPOSABLE) ×1 IMPLANT
GOWN STRL REUS W/ TWL XL LVL3 (GOWN DISPOSABLE) ×1 IMPLANT
GOWN STRL REUS W/TWL LRG LVL3 (GOWN DISPOSABLE) ×3
GOWN STRL REUS W/TWL XL LVL3 (GOWN DISPOSABLE) ×6
KIT TURNOVER CYSTO (KITS) ×3 IMPLANT
MANIFOLD NEPTUNE II (INSTRUMENTS) IMPLANT
PACK BASIN DAY SURGERY FS (CUSTOM PROCEDURE TRAY) ×3 IMPLANT
PADDING ION DISPOSABLE (MISCELLANEOUS) ×3 IMPLANT
POUCH SPECIMEN RETRIEVAL 10MM (ENDOMECHANICALS) ×2 IMPLANT
SET CHOLANGIOGRAPH MIX (MISCELLANEOUS) IMPLANT
SET IRRIG TUBING LAPAROSCOPIC (IRRIGATION / IRRIGATOR) ×3 IMPLANT
SLEEVE SCD COMPRESS KNEE MED (MISCELLANEOUS) ×3 IMPLANT
SOLUTION ANTI FOG 6CC (MISCELLANEOUS) ×3 IMPLANT
STRIP CLOSURE SKIN 1/2X4 (GAUZE/BANDAGES/DRESSINGS) ×2 IMPLANT
SURGILUBE 2OZ TUBE FLIPTOP (MISCELLANEOUS) ×1 IMPLANT
SUT MON AB 4-0 PC3 18 (SUTURE) ×3 IMPLANT
SUT VICRYL 0 UR6 27IN ABS (SUTURE) IMPLANT
SYS BAG RETRIEVAL 10MM (BASKET)
SYSTEM BAG RETRIEVAL 10MM (BASKET) IMPLANT
TOWEL OR 17X26 10 PK STRL BLUE (TOWEL DISPOSABLE) ×4 IMPLANT
TRAY LAPAROSCOPIC (CUSTOM PROCEDURE TRAY) ×6 IMPLANT
TROCAR BLADELESS OPT 5 75 (ENDOMECHANICALS) ×10 IMPLANT
TROCAR XCEL BLADELESS 5X75MML (TROCAR) ×4 IMPLANT
TROCAR XCEL BLUNT TIP 100MML (ENDOMECHANICALS) ×3 IMPLANT
TUBE CONNECTING 12'X1/4 (SUCTIONS)
TUBE CONNECTING 12X1/4 (SUCTIONS) IMPLANT
TUBING INSUFFLATION 10FT LAP (TUBING) ×3 IMPLANT
WATER STERILE IRR 500ML POUR (IV SOLUTION) IMPLANT

## 2019-04-12 NOTE — Anesthesia Postprocedure Evaluation (Signed)
Anesthesia Post Note  Patient: Ryan Ray  Procedure(s) Performed: LAPAROSCOPIC CHOLECYSTECTOMY (N/A Abdomen)     Patient location during evaluation: PACU Anesthesia Type: General Level of consciousness: awake and alert Pain management: pain level controlled Vital Signs Assessment: post-procedure vital signs reviewed and stable Respiratory status: spontaneous breathing, nonlabored ventilation and respiratory function stable Cardiovascular status: blood pressure returned to baseline and stable Postop Assessment: no apparent nausea or vomiting Anesthetic complications: no    Last Vitals:  Vitals:   04/12/19 1115 04/12/19 1215  BP: (!) 143/93 (!) 141/89  Pulse: 75 82  Resp: 18 16  Temp:    SpO2: 100% 98%    Last Pain:  Vitals:   04/12/19 1215  PainSc: 0-No pain                 Lynda Rainwater

## 2019-04-12 NOTE — Transfer of Care (Signed)
Immediate Anesthesia Transfer of Care Note  Patient: Ryan Ray  Procedure(s) Performed: LAPAROSCOPIC CHOLECYSTECTOMY (N/A Abdomen)  Patient Location: PACU  Anesthesia Type:General  Level of Consciousness: sedated and patient cooperative  Airway & Oxygen Therapy: Patient Spontanous Breathing and Patient connected to nasal cannula oxygen  Post-op Assessment: Report given to RN and Post -op Vital signs reviewed and stable  Post vital signs: Reviewed and stable  Last Vitals:  Vitals Value Taken Time  BP 146/91 04/12/19 1050  Temp    Pulse 84 04/12/19 1053  Resp 18 04/12/19 1052  SpO2 91 % 04/12/19 1053  Vitals shown include unvalidated device data.  Last Pain: There were no vitals filed for this visit.       Complications: No apparent anesthesia complications

## 2019-04-12 NOTE — Op Note (Signed)

## 2019-04-12 NOTE — Anesthesia Preprocedure Evaluation (Addendum)
Anesthesia Evaluation  Patient identified by MRN, date of birth, ID band Patient awake    Reviewed: Allergy & Precautions, NPO status , Patient's Chart, lab work & pertinent test results  Airway Mallampati: II  TM Distance: <3 FB Neck ROM: Full    Dental no notable dental hx. (+) Teeth Intact, Dental Advisory Given   Pulmonary sleep apnea ,    Pulmonary exam normal breath sounds clear to auscultation       Cardiovascular negative cardio ROS Normal cardiovascular exam Rhythm:Regular Rate:Normal     Neuro/Psych Anxiety negative neurological ROS  negative psych ROS   GI/Hepatic Neg liver ROS, GERD  ,  Endo/Other  negative endocrine ROSdiabetes, Type 2  Renal/GU negative Renal ROS  negative genitourinary   Musculoskeletal negative musculoskeletal ROS (+)   Abdominal   Peds negative pediatric ROS (+)  Hematology negative hematology ROS (+)   Anesthesia Other Findings   Reproductive/Obstetrics negative OB ROS                            Anesthesia Physical Anesthesia Plan  ASA: III  Anesthesia Plan: General   Post-op Pain Management:    Induction: Intravenous  PONV Risk Score and Plan: 2 and Ondansetron, Midazolam and Treatment may vary due to age or medical condition  Airway Management Planned: Oral ETT  Additional Equipment:   Intra-op Plan:   Post-operative Plan: Extubation in OR  Informed Consent: I have reviewed the patients History and Physical, chart, labs and discussed the procedure including the risks, benefits and alternatives for the proposed anesthesia with the patient or authorized representative who has indicated his/her understanding and acceptance.     Dental advisory given  Plan Discussed with: CRNA  Anesthesia Plan Comments:         Anesthesia Quick Evaluation

## 2019-04-12 NOTE — Discharge Instructions (Signed)
[facility] ______[facility] LAPAROSCOPIC SURGERY: POST OP INSTRUCTIONS Always review your discharge instruction sheet given to you by the facility where your surgery was performed. IF YOU HAVE DISABILITY OR FAMILY LEAVE FORMS, YOU MUST BRING THEM TO THE OFFICE FOR PROCESSING.   DO NOT GIVE THEM TO YOUR DOCTOR.  1. A prescription for pain medication may be given to you upon discharge.  Take your pain medication as prescribed, if needed.  If narcotic pain medicine is not needed, then you may take acetaminophen (Tylenol) or ibuprofen (Advil) as needed. 2. Take your usually prescribed medications unless otherwise directed. 3. If you need a refill on your pain medication, please contact your pharmacy.  They will contact our office to request authorization. Prescriptions will not be filled after 5pm or on week-ends. 4. You should follow a light diet the first few days after arrival home, such as soup and crackers, etc.  Be sure to include lots of fluids daily. 5. Most patients will experience some swelling and bruising in the area of the incisions.  Ice packs will help.  Swelling and bruising can take several days to resolve.  6. It is common to experience some constipation if taking pain medication after surgery.  Increasing fluid intake and taking a stool softener (such as Colace) will usually help or prevent this problem from occurring.  A mild laxative (Milk of Magnesia or Miralax) should be taken according to package instructions if there are no bowel movements after 48 hours. 7. Unless discharge instructions indicate otherwise, you may remove your bandages 24-48 hours after surgery, and you may shower at that time.  You may have steri-strips (small skin tapes) in place directly over the incision.  These strips should be left on the skin for 7-10 days.  If your surgeon used skin glue on the incision, you may shower in 24 hours.  The glue will flake off over the next 2-3 weeks.  Any sutures or  staples will be removed at the office during your follow-up visit. 8. ACTIVITIES:  You may resume regular (light) daily activities beginning the next day--such as daily self-care, walking, climbing stairs--gradually increasing activities as tolerated.  You may have sexual intercourse when it is comfortable.  Refrain from any heavy lifting or straining until approved by your doctor. a. You may drive when you are no longer taking prescription pain medication, you can comfortably wear a seatbelt, and you can safely maneuver your car and apply brakes. b. RETURN TO WORK:  __________________________________________________________ 9. You should see your doctor in the office for a follow-up appointment approximately 2-3 weeks after your surgery.  Make sure that you call for this appointment within a day or two after you arrive home to insure a convenient appointment time. 10. OTHER INSTRUCTIONS:OK TO SHOWER STARTING TOMORROW 11. ICE PACK, TYLENOL, AND IBUPROFEN ALSO FOR PAIN 12. NO LIFTING MORE THAN 15 POUNDS FOR 2 WEEKS __________________________________________________________________________________________________________________________ __________________________________________________________________________________________________________________________ WHEN TO CALL YOUR DOCTOR: 1. Fever over 101.0 2. Inability to urinate 3. Continued bleeding from incision. 4. Increased pain, redness, or drainage from the incision. 5. Increasing abdominal pain  The clinic staff is available to answer your questions during regular business hours.  Please don't hesitate to call and ask to speak to one of the nurses for clinical concerns.  If you have a medical emergency, go to the nearest emergency room or call 911.  A surgeon from [facility] is always on call at the hospital. [address], [address],   [place], [place]  [address] ? [address], [place], [place]   [address] [phone] ?  [phone] ? FAX [phone] Web site: [email or web address]   Post Anesthesia Home Care Instructions  Activity: Get plenty of rest for the remainder of the day. A responsible individual must stay with you for 24 hours following the procedure.  For the next 24 hours, DO NOT: -Drive a car -Paediatric nurse -Drink alcoholic beverages -Take any medication unless instructed by your physician -Make any legal decisions or sign important papers.  Meals: Start with liquid foods such as gelatin or soup. Progress to regular foods as tolerated. Avoid greasy, spicy, heavy foods. If nausea and/or vomiting occur, drink only clear liquids until the nausea and/or vomiting subsides. Call your physician if vomiting continues.  Special Instructions/Symptoms: Your throat may feel dry or sore from the anesthesia or the breathing tube placed in your throat during surgery. If this causes discomfort, gargle with warm salt water. The discomfort should disappear within 24 hours.  If you had a scopolamine patch placed behind your ear for the management of post- operative nausea and/or vomiting:  1. The medication in the patch is effective for 72 hours, after which it should be removed.  Wrap patch in a tissue and discard in the trash. Wash hands thoroughly with soap and water. 2. You may remove the patch earlier than 72 hours if you experience unpleasant side effects which may include dry mouth, dizziness or visual disturbances. 3. Avoid touching the patch. Wash your hands with soap and water after contact with the patch.

## 2019-04-12 NOTE — Anesthesia Procedure Notes (Signed)
Procedure Name: Intubation Date/Time: 04/12/2019 10:02 AM Performed by: Wanita Chamberlain, CRNA Pre-anesthesia Checklist: Patient being monitored, Suction available, Emergency Drugs available, Patient identified and Timeout performed Patient Re-evaluated:Patient Re-evaluated prior to induction Oxygen Delivery Method: Circle system utilized Preoxygenation: Pre-oxygenation with 100% oxygen Induction Type: IV induction Ventilation: Mask ventilation without difficulty Laryngoscope Size: Mac and 4 Grade View: Grade II Tube type: Oral Number of attempts: 1 Airway Equipment and Method: Stylet Placement Confirmation: breath sounds checked- equal and bilateral,  CO2 detector,  positive ETCO2 and ETT inserted through vocal cords under direct vision Secured at: 22 cm Tube secured with: Tape Dental Injury: Teeth and Oropharynx as per pre-operative assessment

## 2019-04-12 NOTE — Interval H&P Note (Signed)
History and Physical Interval Note: no change in H and P  04/12/2019 9:31 AM  Ryan Ray  has presented today for surgery, with the diagnosis of symptomatic cholelithiasis.  The various methods of treatment have been discussed with the patient and family. After consideration of risks, benefits and other options for treatment, the patient has consented to  Procedure(s): LAPAROSCOPIC CHOLECYSTECTOMY (N/A) as a surgical intervention.  The patient's history has been reviewed, patient examined, no change in status, stable for surgery.  I have reviewed the patient's chart and labs.  Questions were answered to the patient's satisfaction.     Coralie Keens

## 2019-04-13 LAB — SURGICAL PATHOLOGY

## 2020-08-09 DIAGNOSIS — E1169 Type 2 diabetes mellitus with other specified complication: Secondary | ICD-10-CM | POA: Diagnosis not present

## 2020-08-09 DIAGNOSIS — R519 Headache, unspecified: Secondary | ICD-10-CM | POA: Diagnosis not present

## 2020-08-29 DIAGNOSIS — E1169 Type 2 diabetes mellitus with other specified complication: Secondary | ICD-10-CM | POA: Diagnosis not present

## 2020-12-24 IMAGING — US US ABDOMEN LIMITED
1 series · 14 of 25 positions shown · non-contrast
Comparison: Renal ultrasound 02/07/2017, CT abdomen/pelvis
08/08/2014

CLINICAL DATA: Right upper quadrant abdominal pain. Additional
history provided: Right upper quadrant abdominal pain for 2 years,
diabetes mellitus.

EXAM:
ULTRASOUND ABDOMEN LIMITED RIGHT UPPER QUADRANT

[Series 1: us abdomen limited · 0.19mm/px · 14 of 46 slices shown]
[im 1/46]
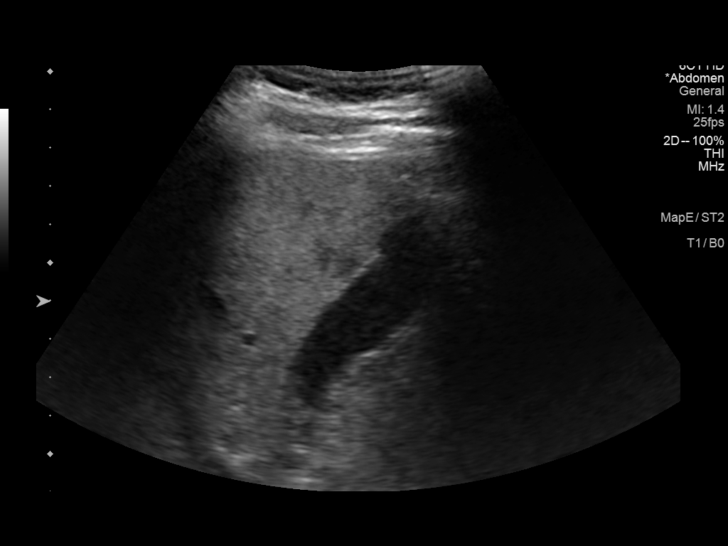
[im 4/46]
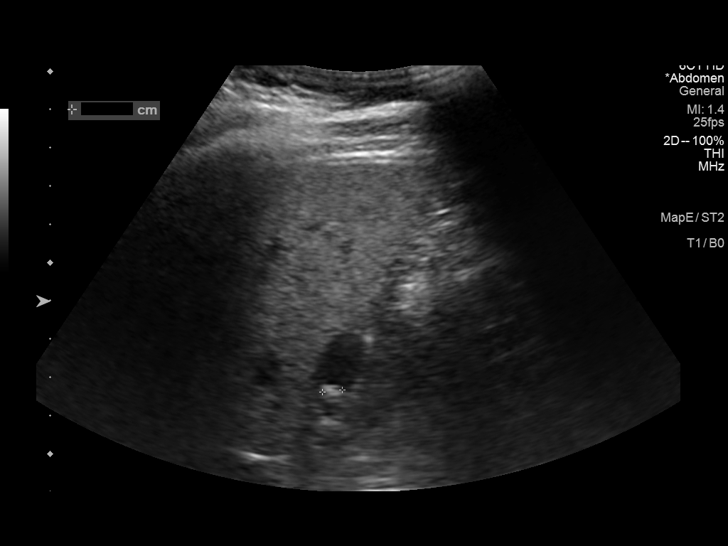
[im 8/46]
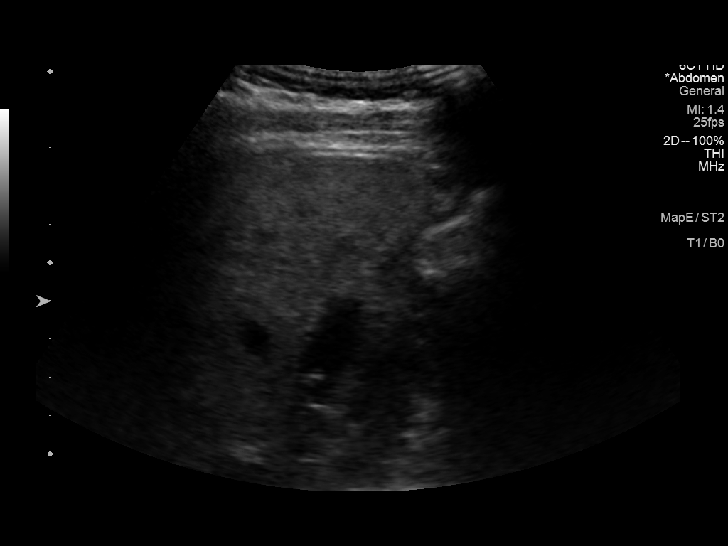
[im 12/46]
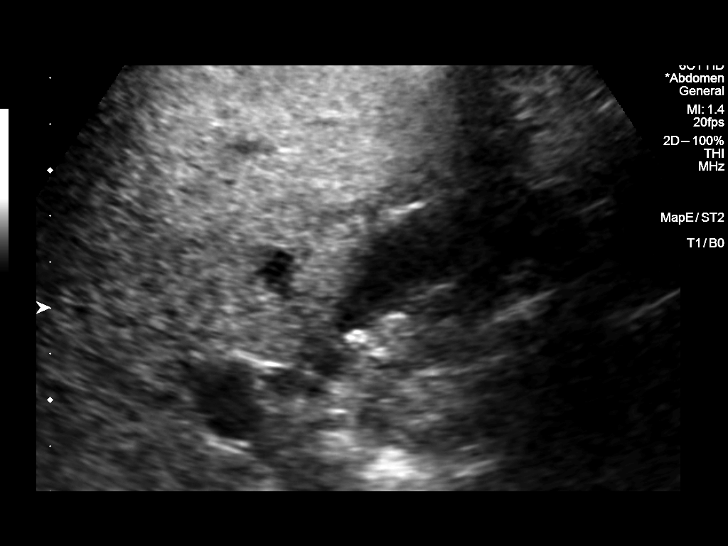
[im 16/46]
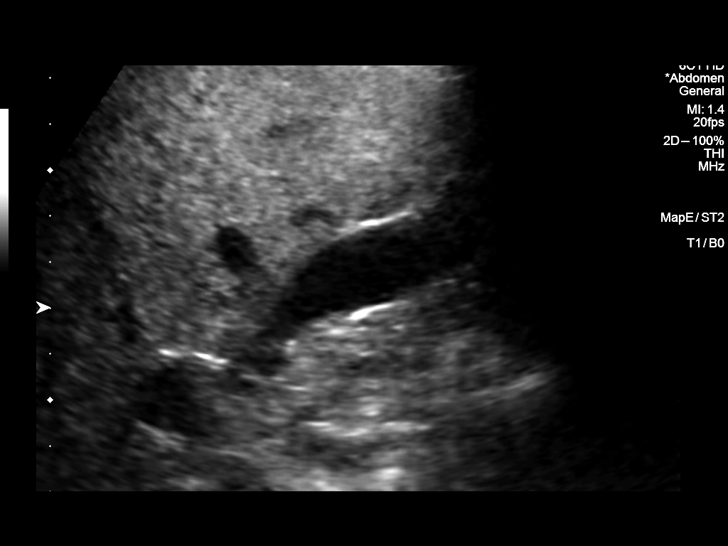
[im 17/46]
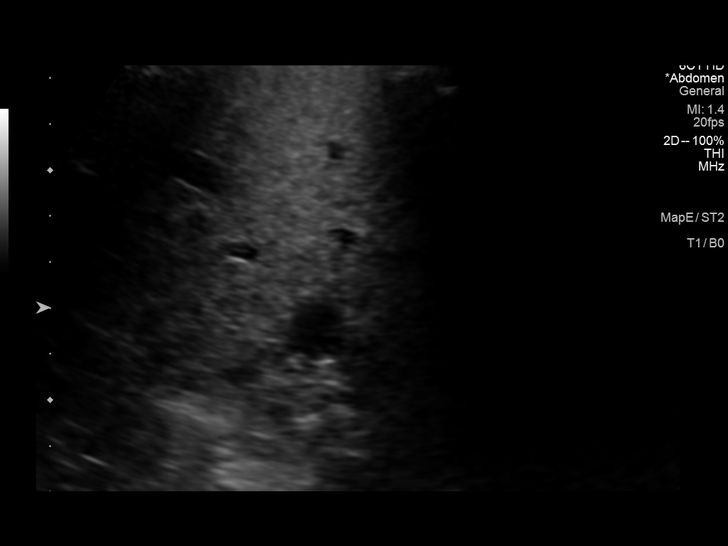
[im 21/46]
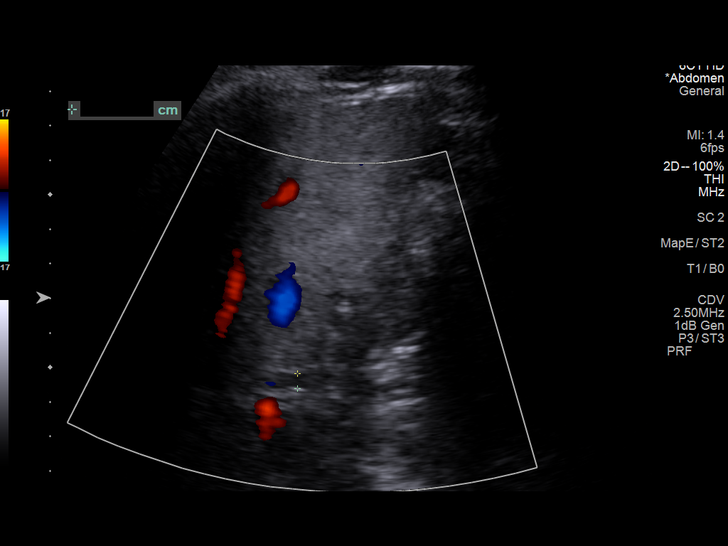
[im 25/46]
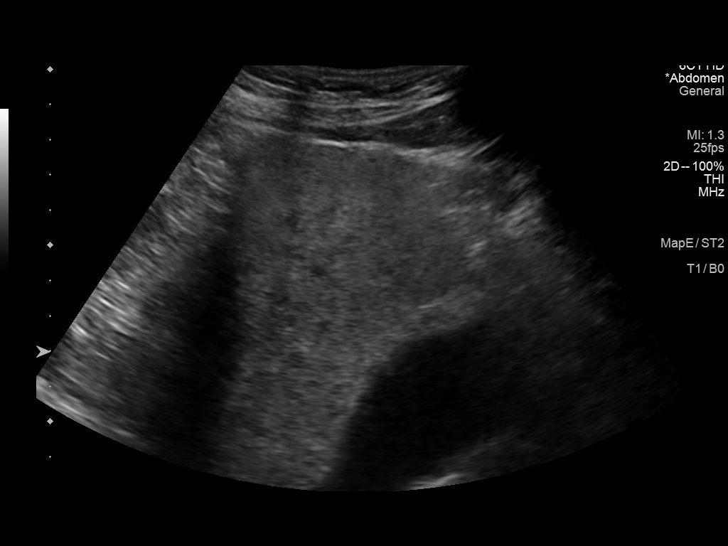
[im 29/46]
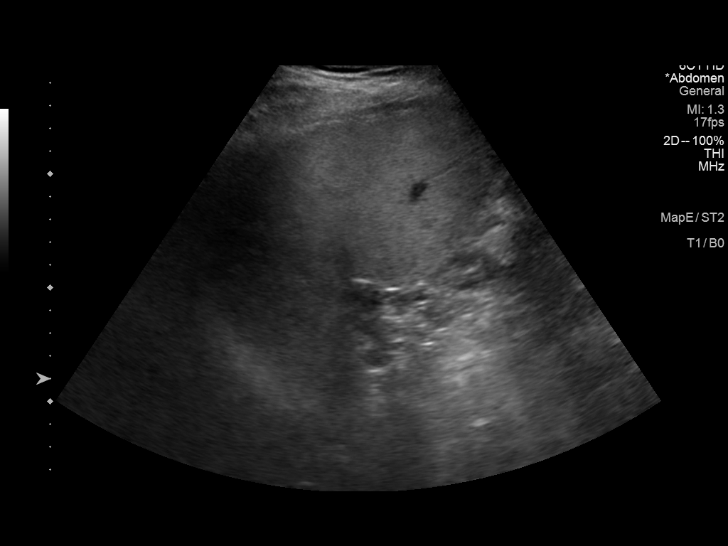
[im 31/46]
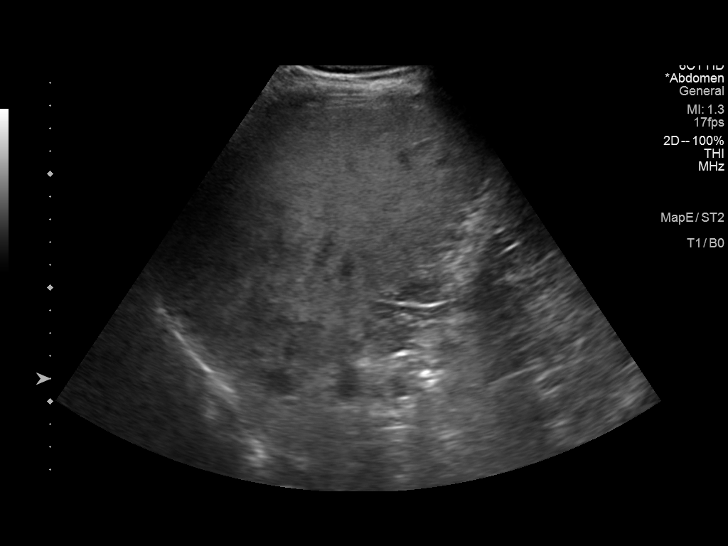
[im 34/46]
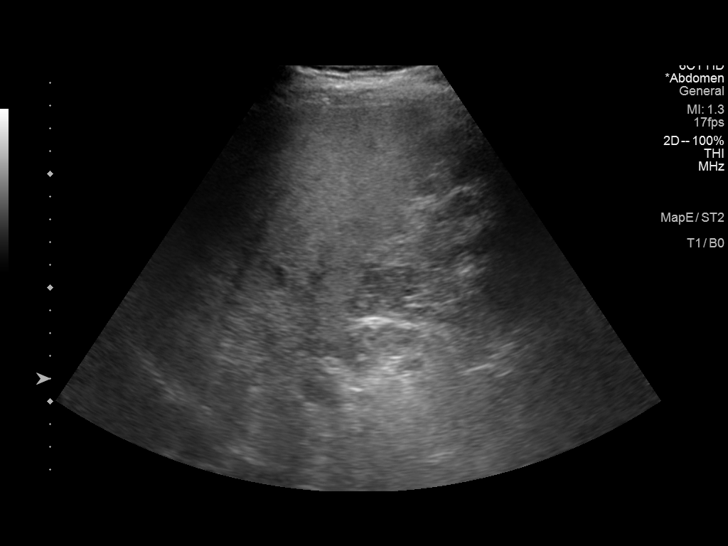
[im 38/46]
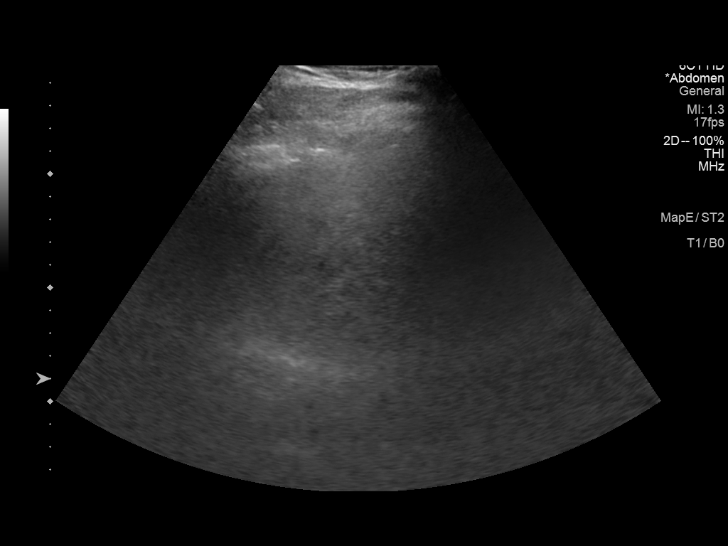
[im 42/46]
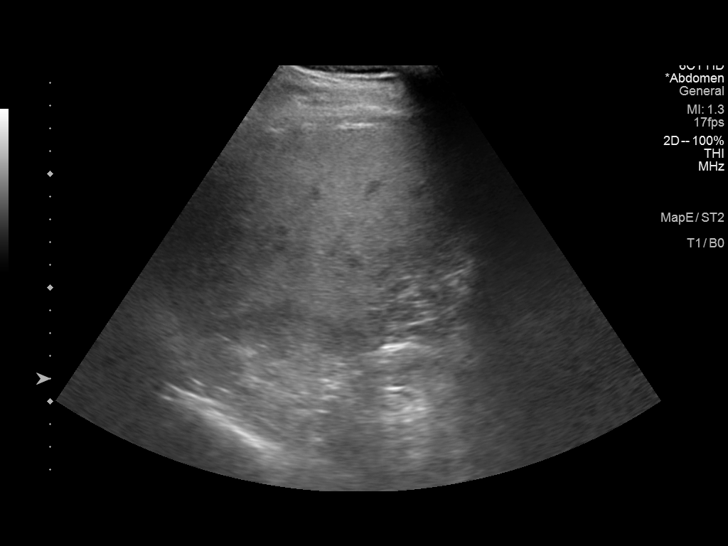
[im 46/46]
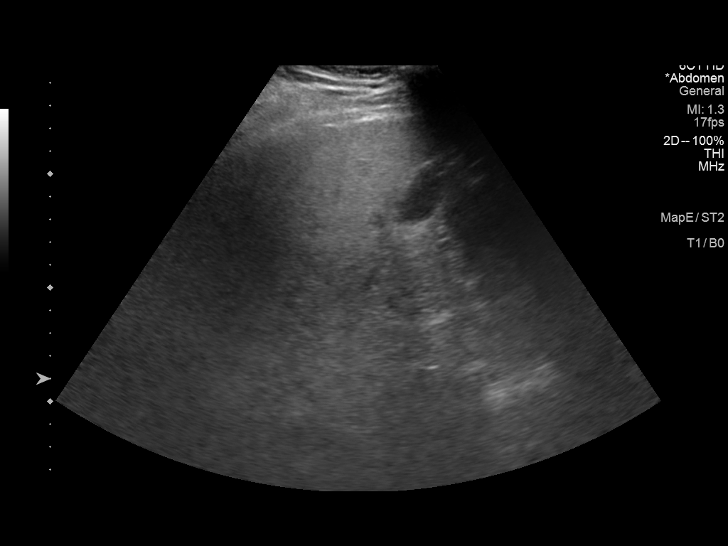

[14 of 25 positions shown; findings below may reference images not displayed]

FINDINGS: Gallbladder:

Cholecystolithiasis. No gallbladder wall thickening visualized. No
sonographic Murphy sign noted by the sonographer.

Common bile duct:

Diameter: 4 mm

Liver:

Increased hepatic parenchymal echogenicity. Regions of probable
fatty sparing along the gallbladder fossa. Portal vein is patent on
color Doppler imaging with normal direction of blood flow towards
the liver.
IMPRESSION: 1. Cholecystolithiasis.
2. Increased hepatic parenchymal echogenicity, a nonspecific
finding, most commonly seen in the setting of hepatic steatosis.

## 2021-01-17 DIAGNOSIS — E1169 Type 2 diabetes mellitus with other specified complication: Secondary | ICD-10-CM | POA: Diagnosis not present

## 2021-01-17 DIAGNOSIS — Z125 Encounter for screening for malignant neoplasm of prostate: Secondary | ICD-10-CM | POA: Diagnosis not present

## 2021-01-17 DIAGNOSIS — E291 Testicular hypofunction: Secondary | ICD-10-CM | POA: Diagnosis not present

## 2021-01-22 DIAGNOSIS — E119 Type 2 diabetes mellitus without complications: Secondary | ICD-10-CM | POA: Diagnosis not present

## 2021-01-22 DIAGNOSIS — E669 Obesity, unspecified: Secondary | ICD-10-CM | POA: Diagnosis not present

## 2021-01-24 DIAGNOSIS — Z0000 Encounter for general adult medical examination without abnormal findings: Secondary | ICD-10-CM | POA: Diagnosis not present

## 2021-01-24 DIAGNOSIS — E538 Deficiency of other specified B group vitamins: Secondary | ICD-10-CM | POA: Diagnosis not present

## 2021-01-24 DIAGNOSIS — Z23 Encounter for immunization: Secondary | ICD-10-CM | POA: Diagnosis not present

## 2021-01-24 DIAGNOSIS — Z1331 Encounter for screening for depression: Secondary | ICD-10-CM | POA: Diagnosis not present

## 2021-01-24 DIAGNOSIS — E1169 Type 2 diabetes mellitus with other specified complication: Secondary | ICD-10-CM | POA: Diagnosis not present

## 2021-01-24 DIAGNOSIS — Z1339 Encounter for screening examination for other mental health and behavioral disorders: Secondary | ICD-10-CM | POA: Diagnosis not present

## 2021-01-24 DIAGNOSIS — R82998 Other abnormal findings in urine: Secondary | ICD-10-CM | POA: Diagnosis not present

## 2021-03-13 DIAGNOSIS — E113292 Type 2 diabetes mellitus with mild nonproliferative diabetic retinopathy without macular edema, left eye: Secondary | ICD-10-CM | POA: Diagnosis not present

## 2021-04-16 DIAGNOSIS — R509 Fever, unspecified: Secondary | ICD-10-CM | POA: Diagnosis not present

## 2021-04-16 DIAGNOSIS — E1169 Type 2 diabetes mellitus with other specified complication: Secondary | ICD-10-CM | POA: Diagnosis not present

## 2021-05-07 DIAGNOSIS — E1169 Type 2 diabetes mellitus with other specified complication: Secondary | ICD-10-CM | POA: Diagnosis not present

## 2021-05-24 DIAGNOSIS — L570 Actinic keratosis: Secondary | ICD-10-CM | POA: Diagnosis not present

## 2021-05-24 DIAGNOSIS — L718 Other rosacea: Secondary | ICD-10-CM | POA: Diagnosis not present

## 2021-08-06 DIAGNOSIS — E1169 Type 2 diabetes mellitus with other specified complication: Secondary | ICD-10-CM | POA: Diagnosis not present

## 2021-08-06 DIAGNOSIS — E785 Hyperlipidemia, unspecified: Secondary | ICD-10-CM | POA: Diagnosis not present

## 2022-02-05 DIAGNOSIS — E1169 Type 2 diabetes mellitus with other specified complication: Secondary | ICD-10-CM | POA: Diagnosis not present

## 2022-02-05 DIAGNOSIS — E298 Other testicular dysfunction: Secondary | ICD-10-CM | POA: Diagnosis not present

## 2022-02-05 DIAGNOSIS — Z125 Encounter for screening for malignant neoplasm of prostate: Secondary | ICD-10-CM | POA: Diagnosis not present

## 2022-02-12 DIAGNOSIS — Z1331 Encounter for screening for depression: Secondary | ICD-10-CM | POA: Diagnosis not present

## 2022-02-12 DIAGNOSIS — E1169 Type 2 diabetes mellitus with other specified complication: Secondary | ICD-10-CM | POA: Diagnosis not present

## 2022-02-12 DIAGNOSIS — Z0000 Encounter for general adult medical examination without abnormal findings: Secondary | ICD-10-CM | POA: Diagnosis not present

## 2022-02-12 DIAGNOSIS — R82998 Other abnormal findings in urine: Secondary | ICD-10-CM | POA: Diagnosis not present

## 2022-02-12 DIAGNOSIS — Z1339 Encounter for screening examination for other mental health and behavioral disorders: Secondary | ICD-10-CM | POA: Diagnosis not present

## 2022-08-05 DIAGNOSIS — M545 Low back pain, unspecified: Secondary | ICD-10-CM | POA: Diagnosis not present

## 2022-09-10 DIAGNOSIS — E1169 Type 2 diabetes mellitus with other specified complication: Secondary | ICD-10-CM | POA: Diagnosis not present

## 2022-10-13 ENCOUNTER — Other Ambulatory Visit: Payer: Self-pay

## 2022-10-13 ENCOUNTER — Encounter (HOSPITAL_BASED_OUTPATIENT_CLINIC_OR_DEPARTMENT_OTHER): Payer: Self-pay

## 2022-10-13 DIAGNOSIS — S20211A Contusion of right front wall of thorax, initial encounter: Secondary | ICD-10-CM | POA: Insufficient documentation

## 2022-10-13 DIAGNOSIS — S299XXA Unspecified injury of thorax, initial encounter: Secondary | ICD-10-CM | POA: Diagnosis not present

## 2022-10-13 DIAGNOSIS — K760 Fatty (change of) liver, not elsewhere classified: Secondary | ICD-10-CM | POA: Insufficient documentation

## 2022-10-13 DIAGNOSIS — Z7984 Long term (current) use of oral hypoglycemic drugs: Secondary | ICD-10-CM | POA: Insufficient documentation

## 2022-10-13 DIAGNOSIS — S50311A Abrasion of right elbow, initial encounter: Secondary | ICD-10-CM | POA: Insufficient documentation

## 2022-10-13 DIAGNOSIS — Y9373 Activity, racquet and hand sports: Secondary | ICD-10-CM | POA: Insufficient documentation

## 2022-10-13 DIAGNOSIS — W1839XA Other fall on same level, initial encounter: Secondary | ICD-10-CM | POA: Insufficient documentation

## 2022-10-13 NOTE — ED Triage Notes (Signed)
Pt was playing pickle ball and fell onto his right side. Pt right and left rib pain, right hip pain, right arm pain. Pt ambulatory to triage. Denies hitting head.Denies neck pain. Took tylenol at 2000 with no relief

## 2022-10-14 ENCOUNTER — Emergency Department (HOSPITAL_BASED_OUTPATIENT_CLINIC_OR_DEPARTMENT_OTHER): Payer: BC Managed Care – PPO

## 2022-10-14 ENCOUNTER — Emergency Department (HOSPITAL_BASED_OUTPATIENT_CLINIC_OR_DEPARTMENT_OTHER): Payer: BC Managed Care – PPO | Admitting: Radiology

## 2022-10-14 ENCOUNTER — Emergency Department (HOSPITAL_BASED_OUTPATIENT_CLINIC_OR_DEPARTMENT_OTHER)
Admission: EM | Admit: 2022-10-14 | Discharge: 2022-10-14 | Disposition: A | Discharge status: Home / Self Care | Payer: BC Managed Care – PPO | Attending: Emergency Medicine | Admitting: Emergency Medicine

## 2022-10-14 DIAGNOSIS — S20211A Contusion of right front wall of thorax, initial encounter: Secondary | ICD-10-CM

## 2022-10-14 DIAGNOSIS — M25521 Pain in right elbow: Secondary | ICD-10-CM | POA: Diagnosis not present

## 2022-10-14 DIAGNOSIS — R079 Chest pain, unspecified: Secondary | ICD-10-CM | POA: Diagnosis not present

## 2022-10-14 DIAGNOSIS — S299XXA Unspecified injury of thorax, initial encounter: Secondary | ICD-10-CM | POA: Diagnosis not present

## 2022-10-14 DIAGNOSIS — W19XXXA Unspecified fall, initial encounter: Secondary | ICD-10-CM

## 2022-10-14 DIAGNOSIS — K760 Fatty (change of) liver, not elsewhere classified: Secondary | ICD-10-CM

## 2022-10-14 DIAGNOSIS — S50311A Abrasion of right elbow, initial encounter: Secondary | ICD-10-CM

## 2022-10-14 DIAGNOSIS — I700 Atherosclerosis of aorta: Secondary | ICD-10-CM | POA: Diagnosis not present

## 2022-10-14 LAB — BASIC METABOLIC PANEL
Anion gap: 11 (ref 5–15)
BUN: 18 mg/dL (ref 6–20)
CO2: 23 mmol/L (ref 22–32)
Calcium: 9.5 mg/dL (ref 8.9–10.3)
Chloride: 103 mmol/L (ref 98–111)
Creatinine, Ser: 1.15 mg/dL (ref 0.61–1.24)
GFR, Estimated: >60 mL/min (ref >60)
Glucose, Bld: 170 mg/dL — ABNORMAL HIGH (ref 70–99)
Potassium: 4.1 mmol/L (ref 3.5–5.1)
Sodium: 137 mmol/L (ref 135–145)

## 2022-10-14 LAB — CBC WITH DIFFERENTIAL/PLATELET
Abs Immature Granulocytes: 0.05 10*3/uL (ref 0.00–0.07)
Basophils Absolute: 0.1 10*3/uL (ref 0.0–0.1)
Basophils Relative: 1 %
Eosinophils Absolute: 0.1 10*3/uL (ref 0.0–0.5)
Eosinophils Relative: 1 %
HCT: 44.7 % (ref 39.0–52.0)
Hemoglobin: 15.7 g/dL (ref 13.0–17.0)
Immature Granulocytes: 1 %
Lymphocytes Relative: 23 %
Lymphs Abs: 2.2 10*3/uL (ref 0.7–4.0)
MCH: 28.3 pg (ref 26.0–34.0)
MCHC: 35.1 g/dL (ref 30.0–36.0)
MCV: 80.5 fL (ref 80.0–100.0)
Monocytes Absolute: 0.7 10*3/uL (ref 0.1–1.0)
Monocytes Relative: 7 %
Neutro Abs: 6.6 10*3/uL (ref 1.7–7.7)
Neutrophils Relative %: 67 %
Platelets: 229 10*3/uL (ref 150–400)
RBC: 5.55 MIL/uL (ref 4.22–5.81)
RDW: 13.1 % (ref 11.5–15.5)
WBC: 9.8 10*3/uL (ref 4.0–10.5)
nRBC: 0 % (ref 0.0–0.2)

## 2022-10-14 LAB — PROTIME-INR
INR: 1.1 (ref 0.8–1.2)
Prothrombin Time: 14 seconds (ref 11.4–15.2)

## 2022-10-14 LAB — TROPONIN I (HIGH SENSITIVITY): Troponin I (High Sensitivity): 4 ng/L (ref <18)

## 2022-10-14 MED ORDER — IOHEXOL 300 MG/ML  SOLN
100.0000 mL | Freq: Once | INTRAMUSCULAR | Status: AC | PRN
  Administered 2022-10-14: 75 mL via INTRAVENOUS

## 2022-10-14 MED ORDER — FENTANYL CITRATE PF 50 MCG/ML IJ SOSY
50.0000 ug | PREFILLED_SYRINGE | Freq: Once | INTRAMUSCULAR | Status: AC
  Administered 2022-10-14: 50 ug via INTRAVENOUS
  Filled 2022-10-14: qty 1

## 2022-10-14 NOTE — ED Provider Notes (Addendum)
[facility] AT [facility] Provider Note   CSN: [id number] Arrival date & time: 10/13/22  2153     History  Chief Complaint  Patient presents with   Ryan Ray is a 62 y.o. male.   Fall     62 year old male presenting to emergency department as a nonlevel trauma after a fall.  The patient states that he was playing pickle ball when fell and landed on his right side.  He did not hit his head and did not lose consciousness.  He is not on anticoagulation.  He stained an abrasion with pain to his right elbow.  Additionally he has had sharp pain in his right side.  He endorses sternal pain as well.  Denies any neck pain or any other pain.  He took Tylenol with no relief.  He is up-to-date on his tetanus.  He arrives GCS 15, ABC intact.  Home Medications Prior to Admission medications   Medication Sig Start Date End Date Taking? Authorizing Provider  glipiZIDE (GLUCOTROL) 5 MG tablet Take 5 mg by mouth 2 (two) times daily before a meal.    [provider]  metformin (FORTAMET) 1000 MG (OSM) 24 hr tablet Take 1,000 mg by mouth 2 (two) times daily with a meal.  02/04/17   [provider]  pantoprazole (PROTONIX) 40 MG tablet Take 40 mg by mouth daily.     [provider]  rosuvastatin (CRESTOR) 10 MG tablet Take 10 mg by mouth at bedtime.    [provider]  Tamsulosin HCl (FLOMAX) 0.4 MG CAPS Take 1 capsule (0.4 mg total) by mouth at bedtime. 11/11/11   Domenick Gong, MD  traMADol (ULTRAM) 50 MG tablet Take 1 tablet (50 mg total) by mouth every 6 (six) hours as needed for moderate pain or severe pain. 04/12/19   Abigail Miyamoto, MD      Allergies    Oxycodone-acetaminophen    Review of Systems   Review of Systems  All other systems reviewed and are negative.   Physical Exam Updated Vital Signs BP 113/78   Pulse 72   Temp 98.9 F (37.2 C)   Resp 14   Ht 5\' 10"  (1.778 m)   Wt 75.8 kg   SpO2 97%    BMI 23.96 kg/m  Physical Exam Vitals and nursing note reviewed.  Constitutional:      Appearance: He is well-developed.     Comments: GCS 15, ABC intact  HENT:     Head: Normocephalic.  Eyes:     Conjunctiva/sclera: Conjunctivae normal.  Neck:     Comments: No midline tenderness to palpation of the cervical spine. ROM intact. Cardiovascular:     Rate and Rhythm: Normal rate and regular rhythm.  Pulmonary:     Effort: Pulmonary effort is normal. No respiratory distress.     Breath sounds: Normal breath sounds.  Chest:     Comments: Chest wall stable, tenderness to palpation of the right rib cage, anterior sternal tenderness as well.  Clavicles stable and non-tender to AP compression Abdominal:     Palpations: Abdomen is soft.     Tenderness: There is no abdominal tenderness.     Comments: Pelvis stable to lateral compression.  Musculoskeletal:     Cervical back: Neck supple.     Comments: No midline tenderness to palpation of the thoracic or lumbar spine. Extremities atraumatic with intact ROM the exception of abrasion to the right elbow with bilateral tenderness  of the elbow with no swelling.  Skin:    General: Skin is warm and dry.  Neurological:     Mental Status: He is alert.     Comments: CN II-XII grossly intact. Moving all four extremities spontaneously and sensation grossly intact.     ED Results / Procedures / Treatments   Labs (all labs ordered are listed, but only abnormal results are displayed) Labs Reviewed  BASIC METABOLIC PANEL - Abnormal; Notable for the following components:      Result Value   Glucose, Bld 170 (*)    All other components within normal limits  CBC WITH DIFFERENTIAL/PLATELET  PROTIME-INR  TROPONIN I (HIGH SENSITIVITY)    EKG EKG Interpretation Date/Time:  Monday October 14 2022 01:27:28 EDT Ventricular Rate:  84 PR Interval:  146 QRS Duration:  83 QT Interval:  342 QTC Calculation: 405 R Axis:   32  Text Interpretation: Sinus  rhythm Confirmed by Ernie Avena ([id number]) on 10/14/2022 1:58:43 AM  Radiology CT Chest W Contrast  Result Date: 10/14/2022 CLINICAL DATA:  Blunt chest trauma. Right-sided chest pain. Right upper extremity pain. EXAM: CT CHEST WITH CONTRAST TECHNIQUE: Multidetector CT imaging of the chest was performed during intravenous contrast administration. RADIATION DOSE REDUCTION: This exam was performed according to the departmental dose-optimization program which includes automated exposure control, adjustment of the mA and/or kV according to patient size and/or use of iterative reconstruction technique. CONTRAST:  75mL OMNIPAQUE IOHEXOL 300 MG/ML  SOLN COMPARISON:  Abdomen and pelvis CT with IV contrast 08/08/2014 FINDINGS: Cardiovascular: The cardiac size is normal. There are scattered three-vessel coronary calcifications. No pericardial effusion. The pulmonary arteries and veins are normal caliber. Pulmonary arteries are centrally clear. There is mild aortic atherosclerosis, small amount of calcific plaque in the great vessels. No aortic or great vessel aneurysm, stenosis or dissection are seen. There is normal variant origin of left vertebral artery from the aortic arch. Mediastinum/Nodes: No enlarged mediastinal, hilar, or axillary lymph nodes. Thyroid gland, trachea, and esophagus demonstrate no significant findings. Lungs/Pleura: Mild chronic elevation right hemidiaphragm. No pleural effusion, thickening or pneumothorax. There are mild posterior atelectatic changes in the lower lobes. Few linear scar-like opacities again in both bases. The lungs are otherwise clear. Upper Abdomen:  Mild splenomegaly with splenic AP axis 14.8 cm. Mildly prominent liver measuring 18 cm length with mild steatosis. No upper abdominal ascites. No acute upper abdominal abnormality is seen. There is increased prominence of the hepatic portal main vein which measures 16.4 mm, previously 13.4 mm. Musculoskeletal: No chest wall abnormality.  No acute or significant osseous findings. IMPRESSION: 1. No acute trauma related findings in the chest. 2. Aortic and coronary artery atherosclerosis. 3. Mild hepatosplenomegaly with mild hepatic steatosis. 4. Increased prominence of the hepatic portal main vein which could be due to early portal hypertension. No upper abdominal ascites. Electronically Signed   By: Almira Bar M.D.   On: 10/14/2022 02:55   DG Elbow Complete Right  Result Date: 10/14/2022 CLINICAL DATA:  Recent fall with right elbow pain, initial encounter EXAM: RIGHT ELBOW - COMPLETE 3+ VIEW COMPARISON:  None Available. FINDINGS: There is no evidence of fracture, dislocation, or joint effusion. There is no evidence of arthropathy or other focal bone abnormality. Soft tissues are unremarkable. IMPRESSION: No acute abnormality noted. Electronically Signed   By: Alcide Clever M.D.   On: 10/14/2022 02:36    Procedures Procedures    Medications Ordered in ED Medications  fentaNYL (SUBLIMAZE) injection 50 mcg (50 mcg  Intravenous Given 10/14/22 0131)  iohexol (OMNIPAQUE) 300 MG/ML solution 100 mL (75 mLs Intravenous Contrast Given 10/14/22 0204)    ED Course/ Medical Decision Making/ A&P                                 Medical Decision Making Amount and/or Complexity of Data Reviewed Labs: ordered. Radiology: ordered.  Risk Prescription drug management.    62 year old male presenting to emergency department as a nonlevel trauma after a fall.  The patient states that he was playing pickle ball when fell and landed on his right side.  He did not hit his head and did not lose consciousness.  He is not on anticoagulation.  He stained an abrasion with pain to his right elbow.  Additionally he has had sharp pain in his right side.  He endorses sternal pain as well.  Denies any neck pain or any other pain.  He took Tylenol with no relief.  He is up-to-date on his tetanus.  He arrives GCS 15, ABC intact.  On arrival, the patient was  vitally stable.  Physical exam as per above with an abrasion to the right elbow and associated tenderness with right-sided chest wall tenderness and sternal tenderness.  Considered rib fracture, sternal fracture.  Considered elbow fracture.  EKG was obtained which revealed sinus rhythm, no acute ischemic changes.  Laboratory evaluation was unremarkable other than hyperglycemia to 170 on BMP, troponin normal, INR normal, CBC unremarkable.  The patient was administered fentanyl for pain control.  XR Right Elbow: Negative  CT Chest W: IMPRESSION:  1. No acute trauma related findings in the chest.  2. Aortic and coronary artery atherosclerosis.  3. Mild hepatosplenomegaly with mild hepatic steatosis.  4. Increased prominence of the hepatic portal main vein which could  be due to early portal hypertension. No upper abdominal ascites.    Informed the patient of his negative findings.  Suspect likely musculoskeletal chest wall contusion.  The patient was informed of the incidental findings noted on his chest CT.  He was advised to follow-up with his PCP regarding these results.  overall stable for discharge with plan for Tylenol and NSAIDs outpatient.  Final Clinical Impression(s) / ED Diagnoses Final diagnoses:  Fall, initial encounter  Contusion of right chest wall, initial encounter  Abrasion of right elbow, initial encounter  Hepatic steatosis    Rx / DC Orders ED Discharge Orders     None         Ernie Avena, MD 10/14/22 0301    Ernie Avena, MD 10/14/22 [phone]

## 2022-10-14 NOTE — Discharge Instructions (Addendum)
Your CT imaging was negative for rib fracture or sternal fracture.  Your symptoms are consistent with likely chest wall contusion.  Recommend Tylenol and ibuprofen for pain control, PCP follow-up. Your CT results: IMPRESSION:  1. No acute trauma related findings in the chest.  2. Aortic and coronary artery atherosclerosis.  3. Mild hepatosplenomegaly with mild hepatic steatosis.  4. Increased prominence of the hepatic portal main vein which could  be due to early portal hypertension. No upper abdominal ascites.   He had an incidental finding on CT of hepatosplenomegaly with mild hepatic steatosis.  Recommend you follow-up with your PCP regarding these results.

## 2023-02-24 DIAGNOSIS — E1169 Type 2 diabetes mellitus with other specified complication: Secondary | ICD-10-CM | POA: Diagnosis not present

## 2023-02-24 DIAGNOSIS — N401 Enlarged prostate with lower urinary tract symptoms: Secondary | ICD-10-CM | POA: Diagnosis not present

## 2023-02-24 DIAGNOSIS — E291 Testicular hypofunction: Secondary | ICD-10-CM | POA: Diagnosis not present

## 2023-02-24 DIAGNOSIS — E781 Pure hyperglyceridemia: Secondary | ICD-10-CM | POA: Diagnosis not present

## 2023-03-04 DIAGNOSIS — Z1331 Encounter for screening for depression: Secondary | ICD-10-CM | POA: Diagnosis not present

## 2023-03-04 DIAGNOSIS — E1169 Type 2 diabetes mellitus with other specified complication: Secondary | ICD-10-CM | POA: Diagnosis not present

## 2023-03-04 DIAGNOSIS — E118 Type 2 diabetes mellitus with unspecified complications: Secondary | ICD-10-CM | POA: Diagnosis not present

## 2023-03-04 DIAGNOSIS — Z23 Encounter for immunization: Secondary | ICD-10-CM | POA: Diagnosis not present

## 2023-03-04 DIAGNOSIS — Z1339 Encounter for screening examination for other mental health and behavioral disorders: Secondary | ICD-10-CM | POA: Diagnosis not present

## 2023-03-04 DIAGNOSIS — Z0000 Encounter for general adult medical examination without abnormal findings: Secondary | ICD-10-CM | POA: Diagnosis not present

## 2023-04-04 DIAGNOSIS — H5203 Hypermetropia, bilateral: Secondary | ICD-10-CM | POA: Diagnosis not present

## 2023-04-04 DIAGNOSIS — H35033 Hypertensive retinopathy, bilateral: Secondary | ICD-10-CM | POA: Diagnosis not present

## 2023-04-04 DIAGNOSIS — E113293 Type 2 diabetes mellitus with mild nonproliferative diabetic retinopathy without macular edema, bilateral: Secondary | ICD-10-CM | POA: Diagnosis not present

## 2023-05-08 DIAGNOSIS — E1169 Type 2 diabetes mellitus with other specified complication: Secondary | ICD-10-CM | POA: Diagnosis not present

## 2023-05-08 DIAGNOSIS — R110 Nausea: Secondary | ICD-10-CM | POA: Diagnosis not present

## 2023-05-08 DIAGNOSIS — R109 Unspecified abdominal pain: Secondary | ICD-10-CM | POA: Diagnosis not present

## 2023-09-03 DIAGNOSIS — E1169 Type 2 diabetes mellitus with other specified complication: Secondary | ICD-10-CM | POA: Diagnosis not present
